# Patient Record
Sex: Male | Born: 1945 | Race: White | Hispanic: No | Marital: Married | Smoking: Current every day smoker
Health system: Southern US, Community
[De-identification: ages and names within clinical notes are randomized; demographics above are authoritative.]

## PROBLEM LIST (undated history)

## (undated) DIAGNOSIS — F1920 Other psychoactive substance dependence, uncomplicated: Secondary | ICD-10-CM

## (undated) DIAGNOSIS — K5790 Diverticulosis of intestine, part unspecified, without perforation or abscess without bleeding: Secondary | ICD-10-CM

## (undated) DIAGNOSIS — I10 Essential (primary) hypertension: Secondary | ICD-10-CM

## (undated) DIAGNOSIS — F0390 Unspecified dementia without behavioral disturbance: Secondary | ICD-10-CM

## (undated) HISTORY — PX: ANKLE SURGERY: SHX546

---

## 2002-01-19 ENCOUNTER — Encounter: Payer: Self-pay | Admitting: Emergency Medicine

## 2002-01-20 ENCOUNTER — Inpatient Hospital Stay (HOSPITAL_COMMUNITY): Admission: EM | Admit: 2002-01-20 | Discharge: 2002-01-24 | Payer: Self-pay | Admitting: Emergency Medicine

## 2005-02-07 ENCOUNTER — Inpatient Hospital Stay (HOSPITAL_COMMUNITY): Admission: EM | Admit: 2005-02-07 | Discharge: 2005-02-08 | Payer: Self-pay | Admitting: Emergency Medicine

## 2005-02-08 ENCOUNTER — Emergency Department (HOSPITAL_COMMUNITY): Admission: EM | Admit: 2005-02-08 | Discharge: 2005-02-08 | Payer: Self-pay | Admitting: Emergency Medicine

## 2005-04-19 ENCOUNTER — Inpatient Hospital Stay (HOSPITAL_COMMUNITY): Admission: EM | Admit: 2005-04-19 | Discharge: 2005-04-23 | Payer: Self-pay | Admitting: Emergency Medicine

## 2006-06-18 ENCOUNTER — Emergency Department (HOSPITAL_COMMUNITY): Admission: EM | Admit: 2006-06-18 | Discharge: 2006-06-19 | Payer: Self-pay | Admitting: Emergency Medicine

## 2007-04-06 ENCOUNTER — Ambulatory Visit: Payer: Self-pay | Admitting: Internal Medicine

## 2007-04-06 ENCOUNTER — Ambulatory Visit: Payer: Self-pay | Admitting: Cardiology

## 2007-04-06 ENCOUNTER — Inpatient Hospital Stay (HOSPITAL_COMMUNITY): Admission: EM | Admit: 2007-04-06 | Discharge: 2007-04-18 | Payer: Self-pay | Admitting: Emergency Medicine

## 2007-04-09 ENCOUNTER — Ambulatory Visit: Payer: Self-pay | Admitting: Vascular Surgery

## 2007-04-09 ENCOUNTER — Encounter: Payer: Self-pay | Admitting: Internal Medicine

## 2007-05-30 ENCOUNTER — Ambulatory Visit: Payer: Self-pay | Admitting: Nurse Practitioner

## 2007-05-30 DIAGNOSIS — F17200 Nicotine dependence, unspecified, uncomplicated: Secondary | ICD-10-CM

## 2007-05-30 DIAGNOSIS — E871 Hypo-osmolality and hyponatremia: Secondary | ICD-10-CM | POA: Insufficient documentation

## 2007-05-30 DIAGNOSIS — I426 Alcoholic cardiomyopathy: Secondary | ICD-10-CM

## 2007-05-30 DIAGNOSIS — D759 Disease of blood and blood-forming organs, unspecified: Secondary | ICD-10-CM | POA: Insufficient documentation

## 2007-05-30 DIAGNOSIS — F1010 Alcohol abuse, uncomplicated: Secondary | ICD-10-CM | POA: Insufficient documentation

## 2007-05-30 DIAGNOSIS — E46 Unspecified protein-calorie malnutrition: Secondary | ICD-10-CM

## 2007-05-30 DIAGNOSIS — IMO0002 Reserved for concepts with insufficient information to code with codable children: Secondary | ICD-10-CM | POA: Insufficient documentation

## 2007-05-30 DIAGNOSIS — E876 Hypokalemia: Secondary | ICD-10-CM

## 2007-06-19 ENCOUNTER — Ambulatory Visit: Payer: Self-pay | Admitting: *Deleted

## 2007-11-19 ENCOUNTER — Ambulatory Visit: Payer: Self-pay | Admitting: Nurse Practitioner

## 2007-11-19 DIAGNOSIS — I10 Essential (primary) hypertension: Secondary | ICD-10-CM

## 2007-11-19 DIAGNOSIS — R5381 Other malaise: Secondary | ICD-10-CM | POA: Insufficient documentation

## 2007-11-19 DIAGNOSIS — R5383 Other fatigue: Secondary | ICD-10-CM

## 2007-11-19 LAB — CONVERTED CEMR LAB
ALT: 9 units/L (ref 0–53)
AST: 13 units/L (ref 0–37)
Basophils Absolute: 0.1 10*3/uL (ref 0.0–0.1)
Basophils Relative: 1 % (ref 0–1)
Calcium: 9.8 mg/dL (ref 8.4–10.5)
Chloride: 96 meq/L (ref 96–112)
Creatinine, Ser: 0.9 mg/dL (ref 0.40–1.50)
MCHC: 31.9 g/dL (ref 30.0–36.0)
Monocytes Absolute: 0.8 10*3/uL (ref 0.1–1.0)
Neutro Abs: 5.2 10*3/uL (ref 1.7–7.7)
Neutrophils Relative %: 62 % (ref 43–77)
Platelets: 292 10*3/uL (ref 150–400)
Potassium: 4.4 meq/L (ref 3.5–5.3)
RDW: 13.4 % (ref 11.5–15.5)
Sodium: 141 meq/L (ref 135–145)

## 2007-11-25 ENCOUNTER — Telehealth (INDEPENDENT_AMBULATORY_CARE_PROVIDER_SITE_OTHER): Payer: Self-pay | Admitting: Nurse Practitioner

## 2008-07-30 ENCOUNTER — Telehealth (INDEPENDENT_AMBULATORY_CARE_PROVIDER_SITE_OTHER): Payer: Self-pay | Admitting: Nurse Practitioner

## 2008-08-28 ENCOUNTER — Telehealth (INDEPENDENT_AMBULATORY_CARE_PROVIDER_SITE_OTHER): Payer: Self-pay | Admitting: Nurse Practitioner

## 2008-09-02 ENCOUNTER — Encounter (INDEPENDENT_AMBULATORY_CARE_PROVIDER_SITE_OTHER): Payer: Self-pay | Admitting: *Deleted

## 2010-12-06 NOTE — H&P (Signed)
NAME:  Edgar Vazquez, Edgar Vazquez                 ACCOUNT NO.:  [id number]   MEDICAL RECORD NO.:  [id number]          PATIENT TYPE:  EMS   LOCATION:  ED                           FACILITY:  [facility]   PHYSICIAN:  Mobolaji B. Bakare, M.D.DATE OF BIRTH:  09/09/1945   DATE OF ADMISSION:  04/06/2007  DATE OF DISCHARGE:                              HISTORY & PHYSICAL   PRIMARY CARE PHYSICIAN:  Unassigned.   CHIEF COMPLAINT:  Lower-extremity weakness and numbness.   HISTORY OF PRESENTING COMPLAINT:  Mr. Peppard is a 65 year old Caucasian  male who resides with his girlfriend.  About 2 weeks ago, he started  experiencing lower-extremity weakness and numbness.  This progressed to  the point where he became bedridden, and he could not walk without  assistance, hence, his girlfriend brought him to the hospital with a  commitment paper.   Patient is able to give history.  He is alert, oriented in time, place  and person.  He admits to having drinking beer one-six pack per day for  several years.  He denies any back pain or trauma.  He has developed  urinary incontinence because he could not get himself to the bathroom  quickly.  Patient has the sensation of bladder fullness.  Likewise, he  knows when he defecates, and he admits to having loose stools about 1 to  2 times a day, which has been ongoing for about a week.  There is no  associated abdominal pain or vomiting but he does have nausea  occasionally.  There is no fever.  He has not had any significant weight  loss.  His usual weight is 150 to 155 pounds.  He admits to having good  appetite.   Patient's last alcohol intake was about 2 weeks ago when he started  getting sick.   REVIEW OF SYSTEMS:  He denies shortness of breath, chest pain, no  vomiting or headaches.  No change in his vision.  He has no weight loss.   PAST MEDICAL HISTORY:  1. Alcohol abuse.  2. History of alcohol withdrawal.  3. History of altered mental status secondary to alcohol  withdrawal.  4. History of hyponatremia.  5. History of peptic ulcer disease.   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  He had surgical repair.   MEDICATIONS:  None.   ALLERGIES:  No known drug allergies.   FAMILY HISTORY:  Significant for emphysema in his mom who is still  alive. Father passed away from cerebral hemorrhage.  Patient has 4 other  siblings.   SOCIAL HISTORY:  He is unemployed.  He lives with his girlfriend and his  girlfriend's son.  Patient smokes cigarettes.  He has been trying to cut  back on his cigarette use from 1 to 2 packs per day to 4 to 5 cigarettes  per day.  He drinks one 6-pack a day.  He quit about 2 weeks ago when he  became sick.   INITIAL VITALS:  Temperature 98.0, blood pressure 115/100, pulse 100,  respiratory rate  20, O2 sats of 97% on room air.  On examination, the  patient is awake, alert, oriented in time, place and person.  He is not  in respiratory distress.  He appears unkempt.  Not pale.  Anicteric.  No  elevated JVD.  No carotid bruit.  Mucous membranes dry.  No oral thrush.  LUNGS:  Clear clinically to auscultation.  CVS:  S1, S2 with no murmurs, gallops or rubs.  ABDOMEN:  Not distended.  Soft, nontender.  Bowel sounds presents.  EXTREMITIES:  No pedal edema or calf tenderness.  Bilateral feet  contraction with dorsiflexion.  SKIN:  Multiple wound scars on the lower extremity resembling healing  impetigo.  CNS:  Cranial nerves intact.  Muscle power 5/5 in the upper limbs.  Patient can barely lift the right lower extremity against gravity.  He  could not lift left lower extremity against gravity.  Dorsiflexion is 3+  bilaterally. Plantar flexion 2/5 bilaterally.  Tendon reflexes, trace  good ankle and knee deep tendon reflexes, 1+ good triceps and biceps  muscle mass.  Lower extremity calf muscles appear atrophic with  diminished muscle tone.  COORDINATION:  Patient has past-pointing  bilaterally.   INITIAL LABORATORY DATA:  Urine drug screen:  Negative.  Urine  microscopy:  White cells __________  bacteria.  Urinalysis shows  specific gravity of 1.018, positive for nitrites, negative for leukocyte  esterase, lipase 22, alcohol level less than 5, sodium 151, potassium  4.4, chloride 95, CO2 26, glucose 155, BUN 5, creatinine 0.8.  Segs:  Alkaline phosphatase 56, AST 55, ALT 52, total protein 6.9, albumin 2.6,  calcium 8.9.  White cell count 7.5, hemoglobin 17.7, hematocrit 56.0,  platelets 281, lymphocytes 11%, neutrophils 75%.  Lumbosacral x-ray  shows that he has degenerative changes not acute to abnormality.   ASSESSMENT AND PLAN:  Mr. Arca is a 65 year old Caucasian male  presenting with progressive lower-extremity weakness associated with  numbness and difficulty with walking.  He has become bedridden.  He will  be admitted for further evaluation.   ADMISSION DIAGNOSIS:  1. Bilateral lower-extremity weakness.  This is a progressive      bilaterally lower-extremity weakness.  We need to rule out cord      compression and spinal stenosis.  Will obtain MRI of the      lumbosacral region and also head CT scan to rule out      hydronephrosis.  Will ask PT and OT to evaluate patient.  Neurology      may be consulted if no acute findings on MRI to warrant      neurosurgical involvement.  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  We      checked vitamin B12 and folate.  Start thiamine 100 milligrams      orally daily, multivitamin 1 daily and folic acid 1 milligrams      daily.  3. Elevated hemoglobin and hematocrit and doubt this is primary      polycythemia.  Will check ferritin level, hydrate and recheck CBC      in a.m.  4. Alcohol abuse.  Patient's last alcohol intake was about 2 weeks ago      according to his report.  He is currently not having withdrawal      symptoms.  Will nevertheless add Ativan 1 to 2 milligrams orally/IV      every  2 hours as needed for withdrawal symptoms.  5. Elevated AST.  Likely secondary to alcohol  abuse.  Will not pursue      further.  He denies IV drug abuse.  6. Hyponatremia.  Likely secondary to poor oral intake and loose      stools.  Will give IV fluid and will check BMET in the morning.  7. Hyperglycemia.  Patient is not known to be diabetic.  This is      probably reflecting random blood glucose.  Will check hemoglobin      A1c and monitor CBG during the course of  hospitalization.  8. Diarrhea/loose stools.  Probably alcohol related.  Will check stool      for CD toxins over parasite culture and fecal      leukocytes.  9. Hypertension.  Two readings of blood pressure in the emergency room      were elevated.  Will start low-dose atenolol 25 milligrams daily.      Mobolaji B. Corky Downs, M.D.  Electronically Signed     MBB/MEDQ  D:  04/06/2007  T:  04/06/2007  Job:  [id number]

## 2010-12-06 NOTE — Discharge Summary (Signed)
NAME:  Edgar Vazquez, LLANAS NO.:  [id number]   MEDICAL RECORD NO.:  [id number]          PATIENT TYPE:  INP   LOCATION:  1422                         FACILITY:  [facility]   PHYSICIAN:  Hillery Aldo, M.D.   DATE OF BIRTH:  30-Dec-1945   DATE OF ADMISSION:  04/06/2007  DATE OF DISCHARGE:                               DISCHARGE SUMMARY   PRIMARY CARE PHYSICIAN:  The patient attends the [facility].  He has no  local primary care physician.   DISCHARGE DIAGNOSES:  1. 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  2. Hypoxic respiratory failure.  3. Sepsis.  4. Pneumonia, community acquired versus aspiration.  5. Alcohol dependency.  6. Alcohol withdrawal syndrome.  7. Hypertension.  8. Hyponatremia.  9. Hypokalemia.  10.Diarrhea.  11.Moderate protein calorie malnutrition.  12.Degenerative disk disease.  13.Tobacco abuse.  14.Thrombocytosis.  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   DISCHARGE MEDICATIONS:  (May be amended by discharging physician.)  1. Clonidine 0.2 mg transdermal patch weekly.  2. Ensure t.i.d.  3. Folic acid 1 mg p.o. daily.  4. Avelox 400 mg p.o. through April 19, 2007.  5. Nicotine patch 14 mg transdermally daily.  6. Protonix 40 mg p.o. daily.  7. Thiamine 100 mg p.o. daily.  8. Albuterol two puffs q.4 h. p.r.n.  9. Lomotil 1-2 tablets q.i.d. p.r.n.  10.Ativan 1 mg to 2 mg p.o. q.3 h. p.r.n.   CONSULTATIONS:  1. Dr. Jeanie Sewer of Psychiatry.  2. Dr. Marchelle Gearing, of Pulmonary Critical Care Medicine.   BRIEF ADMISSION HISTORY OF PRESENT ILLNESS:  The patient is a 65-year-  old male who presented with progressive lower extremity weakness and  numbness to the point where he had become bedridden and could not walk  without assistance.  He has a known history of alcohol dependency.  He  was admitted for further evaluation and workup.   PROCEDURES AND DIAGNOSTIC STUDIES:  1. Lumbar spine films on April 06, 2007  showed diffuse      degenerative changes with no acute abnormalities.  2. CT scan of the head on April 06, 2007 showed chronic      microvascular changes with no acute abnormalities.  3. MRI of the lumbar spine on April 07, 2007 showed degenerative      disk disease.  There was no fracture, mass or compression of the      conus medullaris.  4. Chest x-ray on April 09, 2007 showed new bilateral airspace      disease.  5. Chest x-ray on April 10, 2007 showed improving pulmonary edema      with worsening left pleural effusion and bibasilar atelectasis.  6. Chest x-ray on April 11, 2007 showed slight decrease in      pulmonary vascular congestion.  7. Chest x-ray on April 12, 2007 showed stable left pleural      effusion and left lower lobe atelectasis versus infiltrate.  8. Two-dimensional echocardiogram on April 09, 2007 showed mild to      moderate decrease in left ventricular systolic function with an      ejection fraction of 45%.  Study was inadequate for the evaluation      of left ventricular regional wall motion.  The right ventricle was      mild to moderately dilated.  Right ventricular systolic function      was mild to moderately reduced.  The right atrium was mildly      dilated.  9. Lower extremity venous Dopplers on April 09, 2007 showed no      evidence of deep venous thrombosis, superficial thrombosis or      Baker's cyst.  There was rouleaux flow noted in the popliteal      veins.   DISCHARGE LABORATORY VALUES:  Sodium was 138, potassium 4, chloride 102,  bicarb 29, glucose 107, BUN 1, creatinine 0.72.  CBC showed a white  blood cell count of 6.1, hemoglobin 15.8, hematocrit 47 and platelet  count 421,000.   HOSPITAL COURSE:  PROBLEM #1 - LOWER EXTREMITY WEAKNESS:  The patient  was admitted and a diagnostic workup was initiated with the imaging  studies as noted above.  It was felt that his lower extremity weakness  was likely due to  alcohol-related neuropathy.  The imaging modalities  discussed above did not reveal any compression of the cord or other  problems.  The patient was seen in consultation with the physical and  occupational therapists with recommendations for ongoing rehabilitation.  He was supplemented with vitamin B12 and folic acid during his hospital  stay as well, given his history of alcoholism.   PROBLEM #2 - HYPOXIC RESPIRATORY FAILURE SECONDARY TO PNEUMONIA AND  SEPSIS:  The patient was seen in consultation with the Pulmonary  Critical Care specialist.  He was transiently put on BiPAP, but often  refused this due to agitation and alcohol withdrawal.  With treatment of  his underlying sepsis and pneumonia, his respiratory status improved  dramatically.  Because it was felt that his respiratory failure was  related to pneumonia, he was put on broad-spectrum antibiotics.  At this  point, his respiratory status remained stable.  He has completed 5 days  of therapy with vancomycin and Zosyn and has been transitioned to p.o.  Avelox.  Plans are to have him complete an 10-day course of antibiotic  therapy.   PROBLEM #3 - ALCOHOLISM:  The patient did have a history of heavy  alcohol use and did have withdrawal symptoms while in the hospital.  He  was put on Ativan protocol and at this point he has been detoxified.   PROBLEM #4 - HYPERTENSION:  The patient did develop some hypertension  which was treated with a clonidine patch.  This has controlled his blood  pressure well.   PROBLEM #5 - HYPONATREMIA:  This is likely related to his history of  alcoholism.  It resolved with IV fluids.   PROBLEM #6 - HYPOKALEMIA:  The patient was appropriately repleted.   PROBLEM #7 - DIARRHEA:  The patient did develop some transient diarrhea.  C. difficile toxin studies and stool cultures were negative.  He was  treated with Lomotil p.r.n. and at this point, he is not complaining of  any further diarrhea.    PROBLEM #8 - MODERATE PROTEIN CALORIE MALNUTRITION:  The patient is  malnourished, likely due to his alcoholism.  He was put on Ensure  supplements and at this point, has been eating well.   PROBLEM #9 - TOBACCO ABUSE:  The patient was counseled on cessation and  provided with a nicotine patch during the course of his hospitalization.  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  He has a moderate decrease in systolic  function as noted on 2-D echo echocardiography.  At this point, he has  no evidence of congestive heart failure and should be followed closely  as an outpatient.   DISPOSITION:  The patient's disposition is likely to be through an  inpatient rehabilitation program or skilled nursing facility so he can  have ongoing physical and occupational therapy.  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  He is medically  stable for discharge and will be discharged to such a facility when one  is identified and accepts him on transfer.      Hillery Aldo, M.D.  Electronically Signed     CR/MEDQ  D:  04/14/2007  T:  04/15/2007  Job:  [id number]

## 2010-12-06 NOTE — Discharge Summary (Signed)
NAME:  Edgar Vazquez, Edgar Vazquez NO.:  [id number]   MEDICAL RECORD NO.:  [id number]          PATIENT TYPE:  INP   LOCATION:  1422                         FACILITY:  [facility]   PHYSICIAN:  Isidor Holts, M.D.  DATE OF BIRTH:  10-14-1945   DATE OF ADMISSION:  04/06/2007  DATE OF DISCHARGE:  04/18/2007                               DISCHARGE SUMMARY   ADDENDUM:   [facility]   Patient is unassigned to [place].   DISCHARGE DIAGNOSES:  Refer to interim discharge summary dated April 14, 2007, by Dr. Hillery Aldo.   For discharge medications, consultations, procedures, admission history,  and clinical course, refer to above-mentioned interim summary.  For the  period from April 15, 2007 until April 18, 2007, the following  are pertinent:  The patient remained clinically stable and asymptomatic.  His nutritional needs were addressed per the nutrition's  recommendations.  He continued to undergo physical therapy/occupational  therapy and has experienced considerable improvement in bilateral lower  extremity strength, although he is still somewhat weak and  deconditioned. Clinically, the pneumonia has resolved and patient  completed antibiotic course on April 18, 2007.  He has had no  further electrolyte abnormalities and had no clinical evidence of heart  failure.   DISPOSITION:  Patient was considered clinically stable to be discharged  on April 18, 2007.   DIET:  Heart-healthy.   ACTIVITY:  Recommended to increase activity slowly, otherwise per  PT/OT/rehab.   FOLLOW-UP INSTRUCTIONS:  Patient is recommended to follow up routinely  with his primary MD at the [place].      Isidor Holts, M.D.  Electronically Signed     CO/MEDQ  D:  04/18/2007  T:  04/18/2007  Job:  [id number]

## 2010-12-06 NOTE — Consult Note (Signed)
NAME:  Edgar Vazquez, Edgar Vazquez NO.:  [id number]   MEDICAL RECORD NO.:  [id number]          PATIENT TYPE:  INP   LOCATION:  1222                         FACILITY:  [facility]   PHYSICIAN:  Antonietta Breach, M.D.  DATE OF BIRTH:  06/04/1946   DATE OF CONSULTATION:  04/08/2007  DATE OF DISCHARGE:                                 CONSULTATION   REASON FOR CONSULTATION:  Alcohol dependence.   REFERRING PHYSICIAN:  [facility].   HISTORY OF PRESENT ILLNESS:  Edgar Vazquez is a 65 year old male  admitted to the [facility] on April 06, 2007.   Edgar Vazquez has been drinking excessive alcohol and has been experiencing  lower extremity weakness and numbness.  He has become bedridden and has  had trouble walking without assistance.  He is estimated to have been  drinking at least a six-pack of beer per day for at least a year.  He is  having decreased energy but completely denies depressed mood. He  emphatically denies any suicidal thoughts. He has constructive interests  and goals but he is too weak to achieve them at this time. He has no  hallucinations or delusions.   He can recall the events of the day.  He is cooperative with bedside  care.   PAST PSYCHIATRIC HISTORY:  The patient completely denies any history of  suicidal thoughts or suicide attempts.  He does have history of alcohol  withdrawal symptoms and has had a history of altered mental status  associated with alcohol withdrawal.   FAMILY PSYCHIATRIC HISTORY:  None known.   SOCIAL HISTORY:  Edgar Vazquez has been living with his girlfriend and his  girlfriend's son.  He is unemployed.  He does not use any illegal drugs  although he does smoke a pack of cigarettes.   PAST MEDICAL HISTORY:  1. The patient has a history of peptic ulcer disease.  2. He has had a crush injury to his left foot in 1968 and has had      surgical repair for that.  3. He also has a history of hyponatremia  4. The patient has lower  extremity weakness, peripheral neuropathy.  5. He has had elevated transaminases.  AST 55, ALT 52.  His albumin on      admission was 2.6.   MEDICATIONS:  The MAR is reviewed.   REVIEW OF SYSTEMS:  Noncontributory.   PHYSICAL EXAM:  Edgar Vazquez is a middle-aged male, lying in the supine  position in his hospital bed.  Vital signs are stable.  He had no  abnormal involuntary movements.   MENTAL STATUS EXAM:  Edgar Vazquez is slightly stuporous at the beginning of  the exam and then progresses to being alert as the exam proceeds.  He  has a slightly decreased attention span. His concentration is mildly  decreased. His affect is bright and appropriate.  Mood is within normal  limits.  He is oriented consistently in all spheres.  His memory is  intact to immediate, recent and remote.  He recalls the sitter in the  room earlier in the  day.  His speech is within normal limits.  Fund of  knowledge and intelligence are within normal limits.  Thought process  logical in scope, goal-directed. No loose association.  Thought content:  No thoughts of harming himself.  No thoughts of harming others.  No  delusions.  No hallucinations.  Insight is partial.  Judgment is intact.  The patient does appear to have some insight into his alcohol problem.  He is most concerned about his physical symptoms but he is expressing  some partial interest in alcohol rehabilitation.   ASSESSMENT:  Axis I:  Alcohol dependence.  Axis II:  Deferred.  Axis III:  See general medical problems above.  Axis IV:  General medical, primary support group.  Axis V:  55.   Edgar Vazquez is not at risk to harm himself or others.  He agrees to call  emergency services immediately for any thoughts of harming himself,  thoughts of harming others or other psychiatric emergency symptoms.   RECOMMENDATIONS:  1. Would continue a multivitamin daily, folic acid 1 mg daily and      thiamine 100 mg daily indefinitely.  2. Once the patient is  medically cleared, would involve him in      alcohol/drug services for alcohol relapse prevention.  Would also      recommend 12-step groups.      Antonietta Breach, M.D.  Electronically Signed     JW/MEDQ  D:  04/09/2007  T:  04/09/2007  Job:  [id number]

## 2010-12-09 NOTE — Consult Note (Signed)
NAME:  Edgar Vazquez, Edgar Vazquez                 ACCOUNT NO.:  [id number]   MEDICAL RECORD NO.:  [id number]          PATIENT TYPE:  INP   LOCATION:  5737                         FACILITY:  [facility]   PHYSICIAN:  Pramod P. Pearlean Brownie, MD    DATE OF BIRTH:  1946-06-08   DATE OF CONSULTATION:  DATE OF DISCHARGE:  04/23/2005                                   CONSULTATION   REFERRING PHYSICIAN:  Katha Cabal, MD.   REASON FOR REFERRAL:  Confusion.   HISTORY OF PRESENT ILLNESS:  Edgar Vazquez is a 65 year old Caucasian male, who  was admitted three days ago with agitation and confusion.  On admission, he  was thought to be in alcohol withdrawal and has been treated accordingly,  but he has had some fluctuating confusion since then.  He has a long history  of alcohol abuse and has had multiple admissions for alcohol intoxication as  well as withdrawal.  He has refused or failed detoxification in the past.  There is no history of seizure, focal extremity weakness, stroke, TIA, or  significant neurological problems.   PAST MEDICAL HISTORY:  1.  Alcohol abuse.  2.  Peptic ulcer disease.   HOME MEDICATIONS:  None.   MEDICATION ALLERGIES:  None.   FAMILY HISTORY:  Noncontributory.   SOCIAL HISTORY:  He is retired.  He used to be a Curator.  He lives with  his girlfriend in [place].  He smokes and drinks beer.   REVIEW OF SYSTEMS:  Not obtainable.   PHYSICAL EXAMINATION:  GENERAL:  A middle-aged Caucasian male, who is not in  distress.  VITAL SIGNS:  He is afebrile.  Today's temperature 98.9, pulse rate 74 per  minute and regular, respiratory rate 20 per minute, blood pressure 114/64,  SATs 95% on room air.  HEAD:  Nontraumatic.  NECK:  Supple without bruit.  ENT:  Unremarkable.  CARDIAC:  No murmur or gallop.  LUNGS:  Clear to auscultation.  ABDOMEN:  Soft and nontender.  NEUROLOGICAL:  Patient at present is awake, alert, calm, and cooperative.  There is no aphasia, apraxia, or dysarthria.   He has intact attention and  registration.  His recall is diminished x0 out of 3.  There is no  confabulation or hallucinations.  Eye movements are full range without any  restriction.  Face is symmetric bilaterally.  Movements are normal.  Tongue  is midline.  Motor system exam reveals symmetric upper and lower  extremities, strength, tone, reflexes, coordination and sensation, and he  walks with a steady gait.   DATA REVIEWED:  A CT scan of the head done on April 19, 2005, reveals no  acute abnormalities.  Small vessel chronic microangiopathic changes are  noted.  The rest of the labs are fairly unremarkable except for elevated  ammonia of 36 and AST of 50.  Sodium on admission was low at 126, but  yesterday it was 134.   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  PLAN:  He is now admitted for further brain diagnostic testing as indicated.  Recommend a tapering course of benzodiazepines for his alcohol withdrawal as  well as a trial of antidepressant medications.  I will consult the patient  to quit drinking alcohol.  I also had a long telephone discussion with the  patient's significant other, who seems fairly supportive of the patient  quitting alcohol.  The patient has no family doctor and would benefit by  being referred to [facility] for subsequent medical followup and  medications.   Thank you for the referral.  Kindly call for questions.           ______________________________  Sunny Schlein. Pearlean Brownie, MD     PPS/MEDQ  D:  04/23/2005  T:  04/23/2005  Job:  [id number]

## 2010-12-09 NOTE — Discharge Summary (Signed)
. [facility]  Patient:    Edgar Vazquez, Edgar Vazquez Visit Number: [id number] MRN: [id number]          Service Type: MED Location: 3000 3022 01 Attending Physician:  Lonell Face Dictated by:   Theressa Millard, M.D. Admit Date:  01/19/2002 Disc. Date: 01/23/02                             Discharge Summary  ADMISSION DIAGNOSES: 1. Anxiety. 2. Hallucinations, possible alcohol withdrawal.  DISCHARGE DIAGNOSES: 1. Alcohol dependence. 2. Alcohol withdrawal symptoms consistent with delirium tremens with    hallucinations, diaphoresis, fever, hypertension, tachycardia, all    resolved. 3. Hypokalemia, resolved. 4. Hypomagnesemia, resolved.  HISTORY OF PRESENT ILLNESS:  The patient is a 65 year old white male.  Please see admission History of Present Illness examination for details, but basically the patient has been without significant alcohol ingestion of the 48 hours prior to admission, and he was having early withdrawal symptoms in the emergency room.  HOSPITAL COURSE:  The patient was admitted and initially treated with Ativan. Despite this, over the subsequently 24 hours he became quite agitated, was hallucinating, diaphoretic, febrile, and hypertensive.  He was started on Ativan protocol including 2 mg of Ativan every 6 hours with additional doses q.1-2h. p.r.n.  After approximately 24 hours, during which time he required heavy restraints, he finally settled down, and the subsequent hospitalization was characterized by cooperation, calmness, but still some confusion.  He initially had some mid epigastric discomfort on admission.  He was initially treated with Pepcid, but that was discontinued, and all the discomfort was gone once the withdrawal symptoms had abated.  In regard to fever, chest x-ray, urinalysis, and old laboratory were all within normal limits, and there was no evidence of systemic infection.  Over the last 48 hours of the  hospitalization, he was much calmer, although a sitter was required for patients safety.  He did not have to be restrained at all, and he was pleasantly confused and cooperative.  Ativan continued to be tapered downward during the hospital.  He is transferred to the [facility] in [place] for further treatment of his alcohol dependence.  DISCHARGE MEDICATIONS:  Ativan 1 to 2 mg q.6h. and 1 to 2 mg q.3h. p.r.n. He has already been treated with thiamine, and his potassium and magnesium have been repleted.  CONDITION UPON DISCHARGE:  Improved. Dictated by:   Theressa Millard, M.D. Attending Physician:  Lonell Face DD:  01/23/02 TD:  01/23/02 Job: [id number]

## 2010-12-09 NOTE — Discharge Summary (Signed)
[place]  Patient:    KEMAR, PANDIT Visit Number: [id number] MRN: [id number]          Service Type: MED Location: 3000 3022 01 Attending Physician:  Darnelle Bos Dictated by:   Lilla Shook, M.D. Admit Date:  01/19/2002 Discharge Date: 01/24/2002                             Discharge Summary  ADDENDUM  The patient is being discharged today to his home.  He was initially to be discharged to the [facility] who did not have room in their facility for him at this time.  He was much more stable and was evaluated by the ACT team in consultation with Dr. Jeanie Sewer.  It was determined on July 4 with his current mental status and currently not in alcohol withdrawal that he does not qualify for inpatient commitment for substance abuse.  He declined to be admitted to the [facility] who, again, still did not have any bed availability.  He was given information about outpatient substance abuse services and indicated that he would stay with his brother.  He denied any suicidal intentions, homicidal intentions, and was alert and oriented.  On my exam, I found the same things to be true.  He is a little less tremulous, feels okay, is alert and aware of what the discharge plan currently is.  His brother is on his way to pick him up.  He will go without discharge medications since he was on none as an outpatient.  He has the phone numbers in his pocket for outpatient substance abuse follow-up and is to return to this facility if he becomes more tremulous with more abdominal pain or other symptoms of withdrawal. Dictated by:   Lilla Shook, M.D. Attending Physician:  Darnelle Bos DD:  01/24/02 TD:  01/28/02 Job: [id number]

## 2010-12-09 NOTE — H&P (Signed)
NAME:  Edgar Vazquez, GOMILLION NO.:  [id number]   MEDICAL RECORD NO.:  [id number]          PATIENT TYPE:  EMS   LOCATION:  MAJO                         FACILITY:  [facility]   PHYSICIAN:  Lonia Blood, M.D.      DATE OF BIRTH:  1945/09/18   DATE OF ADMISSION:  02/06/2005  DATE OF DISCHARGE:                                HISTORY & PHYSICAL   PRIMARY CARE PHYSICIAN:  The patient is unassigned.   PRESENTING COMPLAINT:  Altered mental status change.   HISTORY OF PRESENT ILLNESS:  The patient is a 65 year old white male with  known history of alcoholism, previous admission here in June 2003,  subsequently treated at [facility] over at [facility].  The patient has  had multiple inpatient and outpatient treatment for alcoholism.  His  brother, who lives in [place], apparently called and gave history of  patient's most recent treatment.  He is here again today with altered mental  status change and serum alcohol level of more than 400.  The patient is  currently better mentally but still in bad shape.  The patient is unable to  give any meaningful history.  History is gleaned from his brother as well as  old records.   PAST MEDICAL HISTORY:  Mainly alcoholism.   MEDICATIONS:  None on record.   ALLERGIES:  No known drug allergies.   SOCIAL HISTORY:  Per brother and old records, patient is currently  alcoholic, apparently just moved in with a new girlfriend.  After his  treatment with Dr. Pamala Hurry in [place], he lived with his brother and his  mother for two years.  The patient moved out one year ago.  A history of  tobacco smoking of a half pack to one pack per day.   FAMILY HISTORY:  Not obtainable at this time.   REVIEW OF SYSTEMS:  Also not obtainable as the patient is not reliable  historically at this point.   PHYSICAL EXAMINATION:  VITAL SIGNS:  Temperature is 97.7, blood pressure  102/69, pulse 83, respiratory rate 18.  His saturation is 95% on room air.  GENERAL:   The patient is aggressive and combative, restrained at both of the  wrists and the ankles.  He smells of alcohol and very much unkempt.  HEENT:  Pupils equal, round and reactive.  Poor dentition.  NECK:  Supple, no JVD, no lymphadenopathy.  RESPIRATORY:  He has good air entry bilaterally, no wheezes or rales, no  crackles.  CARDIOVASCULAR:  The patient is currently tachycardic.  ABDOMEN:  Full, nontender, positive bowel sounds.  EXTREMITIES:  The patient has no edema, cyanosis or clubbing.  He has  visible athlete's foot at the sole of the foot.   His labs showed a white count of 4.8, hemoglobin 14.8, platelets 227, with  normal differential.  Acetaminophen level less than 10.  Alcohol level  initially 407, over time dropped to 323 and then 231 and currently 127.  Lipase 28.  Salicylate less than 4.  Sodium 136, potassium 3.9, chloride  100, CO2 22, glucose 103, BUN 3, creatinine 0.8,  calcium 8.4.  Total protein  7.2, albumin 3.7, AST 94, ALT 45, alkaline phosphatase 55, total bilirubin  0.7.  His UA is essentially clean with urine rbc's 3-6.  Urine drug screen  also negative.  Tricyclics were negative.   ASSESSMENT:  This 65 year old known alcoholic presented with alcohol  intoxication and associated agitation.  The patient's alcohol level is  dropping.  The main fear will be more for delirium tremens in the next one  to two days.  At this time the patient is restrained.   PLAN:  1.  Alcohol intoxication:  We will keep the patient in a telemetry bed.  We      will maintain restraints as needed.  Continue with p.r.n. Ativan as      needed to control his agitation.  Primarily will focus on conservative      measures at this point.  Once the patient is fully awake and doing      better, will get psychiatry consult and possibly inpatient treatment if      patient qualifies.  For his alcoholism, will proceed as above.  I will      add thiamine and folate while the patient is in the  hospital.  Will      observe him for DT.  If he goes into DT in the next probably two to      three days, we will initiate a DT protocol.  2.  Tobacco abuse:  We will offer nicotine patch for patient to use if he      gets agitated.  In the meantime, once the patient is fully awake and      stable, would initiate tobacco cessation counseling.  3.  Athlete's foot:  The patient seems to have some tinea pedis.  I will      proceed to give him some topical treatment.  4.  Mild liver function tests increase:  This is most likely secondary to      his alcoholism.  Will continue with a multivitamin and thiamine and      folate as indicated.       LG/MEDQ  D:  02/07/2005  T:  02/07/2005  Job:  [id number]

## 2010-12-09 NOTE — Discharge Summary (Signed)
NAMEANAND, Edgar NO.:  [id number]   MEDICAL RECORD NO.:  [id number]          PATIENT TYPE:  INP   LOCATION:  5737                         FACILITY:  [facility]   PHYSICIAN:  Edgar Vazquez, M.D.DATE OF BIRTH:  [birth year]   DATE OF ADMISSION:  04/19/2005  DATE OF DISCHARGE:  04/23/2005                                 DISCHARGE SUMMARY   ADMISSION DIAGNOSES:  1.  Altered mental status with a working diagnosis of alcohol      withdrawal/alcohol related seizure.  2.  Dehydration.  3.  Hypernatremia.  4.  Alcohol dependence.   DISCHARGE DIAGNOSES:  1.  Alcohol dependence.  2.  Resolved confusional state, improved mental status changes, mental      status change secondary to alcohol withdrawal.  3.  Depression.  4.  Tobacco abuse.   DISPOSITION:  Patient discharged to home, to follow up with the primary care  physician within one week.   CONSULTATIONS:  Consultation is requested of neurology, Dr. Pearlean Brownie.   HISTORY AND PHYSICAL:  For complete history and physical please refer to the  detailed note by Dr. Karie Mainland on April 19, 2005.  Briefly this is a 65-year-  old Caucasian male who was brought in by emergency medical services  secondary to confusion and agitation. The patient was known to have alcohol  abuse problems. The patient was admitted with a working diagnosis of  confusional state secondary to alcohol withdrawal/alcohol related seizures.  The patient known to have a past medical history of peptic ulcer disease and  gastritis, known to have previous admission secondary to similar changes in  mental status.   ADMISSION LABS:  Sodium 126, potassium 3.6, chloride 94, glucose 102, BUN  less than 3, creatinine 0.7. Urinalysis was within normal limits. Urine drug  screen was negative for enlisted drugs. Alcohol level was less than 5,  salicylate level less than 4, acetaminophen level was less than 10. Serum  ammonia was to the higher end of normal being  36.  Hepatic function panel  showed slightly abnormal levels with AST of 50, ALT of 25, alkaline  phosphatase 57, and total bilirubin of 0.9. Creatinine kinase and CK-MB done  was within normal limits.   RADIOLOGY DATA:  1.  The patient had a CT scan of head without contrast which showed chronic      small vessel disease and no acute intracranial abnormalities.  2.  Portable chest x-ray done on admission showed no acute  cardiopulmonary      findings, overall appearance stable since the last examination which was      done in July 2006.  3.  Blood culture times two sets were sent on admission which were negative.   HOSPITAL COURSE:   PROBLEM #1:  ALTERED MENTAL STATUS.  In light of history of alcohol dependence, the working diagnosis was mental  status changes secondary to alcohol withdrawal. It was not clear whether the  patient had an alcohol related seizure or not. The patient was admitted to  general  medical floor. Secondary to acute agitation he needed to  be  restrained. CT scan of his head was obtained with results as mentioned  above. He was started on thiamine and folate IV and p.r.n. dosages of Ativan  for his withdrawal symptoms. His initial ammonia level was 36, high end of  normal, suggesting some component of hepatic encephalopathy. Throughout the  hospital stay, the patient's mental status continued to improve. On the day  of discharge the patient was alert and oriented times three, answering  questions appropriately. He seemed back to his baseline. His girlfriend is  by the bedside. The patient discharged to home on tapering dosages of Ativan  to be taken over the next six days. Instructed not to drive while taking  Ativan. Also instructed to stop alcohol.   Throughout the hospital stay secondary to acute mental status changes, some  more lab tests were obtained, TSH was 1.147, RPR nonreactive. Vitamin B12  was 453, which is within normal range.   The patient was  also evaluated by neurology with further suggestions by  neurology were to place him on tapering doses of benzodiazepine and it was  felt that there was no need for further testing for his acute mental status  changes. It was felt that depression is contributing to his overall medical  problems as well.   PROBLEM #2:  Alcohol abuse. The patient was counseled, patient not motivated  to quit at all. The patient refused admission to a rehab program.   PROBLEM #3:  Hyponatremia on admission. His sodium on admission was 126.  With administration of normal saline this continued to improve and by the  day of discharge it was 135, which was within normal limits.   PROBLEM #4:  History of peptic ulcer disease. Throughout the hospital stay  the patient was continued on Protonix 40 mg daily. This was prescribed to  him upon discharge as well.   PROBLEM #5:  Possible depression. This was discussed with the patient.  Currently, with questionable compliance issues, patient not started on  antidepressant medications. The patient will need to find himself a primary  care physician and regularly follow up, once sure of his compliance with  medications, he can be started on antidepressants as an outpatient.  Therefore, the patient was not given any antidepressant prescriptions upon  discharge. This was discussed with the patient and his girlfriend in detail.   Social worker provided the patient with list of potential primary care  physicians. The patient seems to be motivated to follow up with the primary  care physician.   On the day of discharge the patient is back to his baseline mental status.  The patient discharged to home to follow up with primary care physician in  one week.           ______________________________  Edgar Vazquez, M.D.     PMJ/MEDQ  D:  04/23/2005  T:  04/23/2005  Job:  [id number]

## 2010-12-09 NOTE — H&P (Signed)
NAME:  Edgar Vazquez, LYFORD NO.:  [id number]   MEDICAL RECORD NO.:  [id number]          PATIENT TYPE:  INP   LOCATION:  5737                         FACILITY:  [facility]   PHYSICIAN:  Renato Battles, M.D.     DATE OF BIRTH:  1946-06-14   DATE OF ADMISSION:  04/19/2005  DATE OF DISCHARGE:                                HISTORY & PHYSICAL   PRIMARY CARE PHYSICIAN:  Unassigned.   REASON FOR ADMISSION:  Altered mental status.   HISTORY OF PRESENT ILLNESS:  The patient is a 65 year old white male for  whom somebody called EMS to come pick him up secondary to agitation and  confusion.  When EMS arrived on the scene, the patient was indeed agitated  and combative.  He had to be restrained and transferred to the emergency  room.  There was no seizure activity noted.  He was noted to be confused and  diaphoretic.  Did not indicate any chest pain or any other pain.  In the  emergency room, the patient made very quick recovery.  Mental status  improved tremendously.  He became calm and cooperative.  Attempts to contact  the home he was picked up from failed, no one would answer.  There is no  family around at this point.   The patient denies any pain.  He has no complaints except for being  restrained.   REVIEW OF SYSTEMS:  Unobtainable secondary to the patient's mental status.  He denies any complaints, but he would not go into details.  He would not  answer many questions.   PAST MEDICAL HISTORY:  1.  Alcoholism.  2.  History of peptic ulcer disease and gastritis.  3.  The patient was admitted once before recently in July of 2006 to this      hospital with alcohol intoxication and he was evaluated by a      psychiatrist the next day and was found to be competent.  He declined      detox treatment and left AMA the very next day.   PAST SURGICAL HISTORY:  Unknown.   FAMILY HISTORY:  Noncontributory, unknown.   SOCIAL HISTORY:  Positive history of tobacco and alcohol abuse.   Denies  drugs.  He says that at this time he takes very little alcohol and does not  comment any further.   ALLERGIES:  No known drug allergies.   MEDICATIONS:  None.   PHYSICAL EXAMINATION:  GENERAL:  Alert and oriented to self and place, not  to time.  VITAL SIGNS:  Temperature rectal 100.4, heart rate 130, respiratory rate 28,  blood pressure 156/112.  HEENT:  Normocephalic and atraumatic.  Pupils equal, round, and reactive to  light and accommodation.  Extraocular movements intact bilaterally.  NECK:  No lymphadenopathy, no thyromegaly, no JVD.  CHEST:  Clear to auscultation bilaterally.  No wheezing, rales, or rhonchi.  HEART:  Regular rhythm, tachycardia, no murmurs.  ABDOMEN:  Soft, nontender, and nondistended.  Normal active bowel sounds.  EXTREMITIES:  No cyanosis, clubbing, or edema.   STUDIES:  Electrolytes showed low  sodium at 126, normal electrolytes  otherwise.  Normal kidney function.  Ammonia was mildly elevated at 36.  UA  was negative.  Magnesium was normal.  Aspirin salicylate levels were  undetectable.  Alcohol level undetectable.  Drug screen was negative.  Liver  function showed low albumin at 3.2, but otherwise normal.  CPK was normal at  125.   Chest x-ray was negative.   ASSESSMENT:  1.  Altered mental status.  Working diagnosis at this point is alcohol      withdrawal/alcohol seizure.  Also in the differential is severe      dehydration.  At this time we will continue to keep the patient in      restraints.  We will try to wean him off restraints if his mental status      continued to improve and he posed no risks to himself or others.  We are      going to obtain head CT and CBC.  2.  Alcoholism.  The patient will receive thiamine folate as well as Ativan      p.r.n. for agitation.  Substance abuse education will be provided.  Case      manager consult also will be requested.  3.  Dehydration.  This will be treated with IV fluids.  4.  Hypernatremia.   Again, this is most likely secondary to fluid imbalance      secondary to dehydration.  I am going to give the patient some normal      saline, IV fluids and recheck sodium level in the morning.  5.  History of peptic ulcer disease and gastritis.  The patient will be on      Protonix.      Renato Battles, M.D.  Electronically Signed     SA/MEDQ  D:  04/19/2005  T:  04/20/2005  Job:  [id number]

## 2010-12-09 NOTE — H&P (Signed)
[place]  Patient:    Edgar Vazquez, Edgar Vazquez Visit Number: [id number] MRN: [id number]          Service Type: MED Location: [id number] Attending Physician:  Lonell Face Dictated by:   Oley Balm Georgina Pillion, M.D. Admit Date:  01/19/2002                           History and Physical  DATE OF BIRTH:  02-Aug-1945.  TIME OF ADMISSION:  10:50 p.m.  CHIEF COMPLAINT:  Disoriented, fever, anxiety for one day.  HISTORY OF PRESENT ILLNESS:  A 65 year old white male with no local medical doctor.  I was called in to admit patient because my partner, Dr. Jethro Bastos, was on "unassigned call."  I was called on this Sunday evening, January 19, 2002, to evaluate and admit the patient.  The patient is a longstanding alcoholic for over 30 years.  His last drink was about 48 hours ago when his girlfriend "kicked him out" from their house.  The patients brother, Luisa Hart, is here and provides most of the history. Patricks phone number is [phone].  He normally drinks at least eight beers per day perhaps more.  For the past 48 hours, he has been staying with his brother who has provided supportive care and the brother states he has not had any alcohol in the past 48 hours.  Today, there has been noted marked increase in anxiety, some sharp epigastric pain that is unrelieved with Tylenol.  He complains of some vague bilateral leg numbness that is intermittent.  Earlier today, he had some vague hallucinations.  No seizures.  He has had mild nausea and vomited yesterday and the day before but not today.  He tolerated a moderate amount of food today without vomiting.  Denies change of bowel habits, melena, or bright red blood per rectum.  Denies syncope or focal weakness.  Denies coryza or cough.  Yesterday, he had some minimally blood-tinged rhinorrhea but that has resolved.  He denies colored rhinorrhea or sore throat.  Admits to fevers and chills.  He has  had some sweating.  He is very vague about the duration of his epigastric pain.  He stated that it has been going on for years but perhaps worse in the past couple of weeks.  Does not radiate.  As noted above, he was disoriented and hallucinating earlier today but is not hallucinating now.  PAST MEDICAL HISTORY:  Longstanding alcoholism.  Brother states he was hospitalized at a [facility] March 07, 2001.  There was a question of an Agent Orange exposure in the past but I cannot verify this.  ALLERGIES:  No known drug allergies.  FAMILY HISTORY:  Brother states some time in the 74s.  He did have alcohol withdrawal seizures but not since then.  The brother states that the patient was treated at [facility] 12 years ago but has not been any other specific alcohol treatment programs.  CURRENT MEDICATIONS:  None.  PAST MEDICAL HISTORY:  He denies history of diabetes, heart disease, hypertension, or other chronic medical problems.  SOCIAL HISTORY:  Heavy alcohol use as per above.  [place] veteran.  He smokes a half a pack a day for many years.  Denies any illegal drugs.  Unemployed. Single.  As described above, he was recently "kicked out" of his girlfriends home 48 hours ago.  The patients brother, Luisa Hart, states that his girlfriend  is supplying him with a steady amount of beer to drink daily.  REVIEW OF SYSTEMS:  See HPI.  Denies chest pain or dyspnea.  No back pain. Denies any urinary symptoms.  Denies suicidal or homicidal ideation.  Admits to anxiety.  He denies depression or hallucinations at this time.  FAMILY HISTORY:  Father died of cerebral hemorrhage at age 63 and had an alcohol problem.  Mother alive at age 63 with hypertension.  Two brothers are both recovering alcoholic.  Both brothers have been sober for at least 12 years.  PHYSICAL EXAMINATION:  GENERAL:  Alert, fatigued, cooperative white male lying on the stretcher.  He could walk unaided but gait was  somewhat unsteady.  He was anxious, slightly confused.  Appeared very tremulous.  He was oriented to that he was in the hospital but felt this was [facility].  He correctly said this was Monday as this is Sunday.  He correctly said it was July as this is June.  He correctly answered his full name.  VITAL SIGNS:  Blood pressure 137/86, pulse 85, respiratory rate 22.  Pulse is regular.  Temperature 101.3.  O2 saturation 98% on room air.  HEENT:  Normocephalic and atraumatic.  Eyes are nonicteric.  EOMI.  PERRL. Ears show bilateral cerumen impaction.  Nose shows some mild nasal congestion but no bleeding, masses, or discharge.  Pharynx is clear.  Teeth with some poor dentition.  No exudate.  NECK:  Supple.  Nontender without thyromegaly or masses.  Carotids are 2+ and equal without bruits.  HEART:  Regular rate and rhythm without murmurs, rubs, or gallops.  LUNGS:  Clear.  ABDOMEN:  Soft.  Bowel sounds normoactive x 4.  The liver edge was percussed and palpated 3 cm below the right costal margin.  No other masses felt.  There was mild epigastric tenderness but no rebound tenderness.  GENITOURINARY:  Without masses or swelling.  RECTAL:  Per the physician assistant here in the ER was negative for masses. Brown heme negative stool on glove.  NEUROLOGICAL:  He had a very prominent resting tremor.  Appeared very tremulous.  Cranial nerves II-XII intact.  DTRs brisk, 2+ and equal at the biceps, Achilles, and patella reflexes bilaterally.  Motor is intact in the upper and lower extremities.  Sensation intact in the upper and lower extremities.  Cerebellar testing of the finger-to-nose test was very poor and could not sufficiently perform the finger-to-nose test.  SKIN:  Ruddy face without any other specific lesions.  Appeared warm and dry.  EXTREMITIES:  Without clubbing, cyanosis, or edema.  LABORATORY DATA:  Initial laboratory values show sodium 131, potassium  3.4, chloride 95, CO2 27, BUN 2, creatinine 0.8, glucose 125.  Hemoglobin 14.5 with  MCV 9, WBC 5.8, platelet count 83,000.  Lipase was 31 which is within normal limits.  Calcium 8.6, albumin 3.1.  AST mildly elevated at 112.  ALT mildly elevated at 59, and bilirubin mildly at 1.4.  Alkaline phosphatase normal at 57.  Serum alcohol level less than 5.  Chest x-ray showed no active disease.  IMPRESSION: 74. A 65 year old male with acute alcohol withdrawal, alcoholic. 2. Epigastric pain that may simply be from gastritis or could be peptic ulcer    disease or early pancreatitis. 3. History of tobacco abuse. 4. Hyponatremia. 5. Hypokalemia. 6. Mild dehydration.  PLAN:  We will admit to aggressively treat the alcohol withdrawal and hopefully prevent DTs.  Thiamine 100 mg IV was already given in the  ER.  IV fluids with potassium replacement.  We will follow electrolytes.  Lorazepam 1 to 2 mg IV q.4-6h.  We will give supportive care and frequent neurologic checks.  Although he has a fever that is most likely from the acute alcohol withdrawal and there is no evidence of any infection on clinical exam, we will still order blood cultures x 2.  Check UA and urine C&S.  Once he is medically stable, we will need placement in to some type of alcohol treatment program. Dictated by:   Oley Balm. Georgina Pillion, M.D. Attending Physician:  Lonell Face DD:  01/20/02 TD:  01/20/02 Job: [phone] [id number]

## 2011-05-04 LAB — CULTURE, BLOOD (ROUTINE X 2): Culture: NO GROWTH

## 2011-05-04 LAB — BLOOD GAS, ARTERIAL
Acid-Base Excess: 0.3
Acid-base deficit: 5 — ABNORMAL HIGH
Bicarbonate: 21.3
Bicarbonate: 22.4
Bicarbonate: 23.9
Bicarbonate: 24
Bicarbonate: 26.9 — ABNORMAL HIGH
Delivery systems: POSITIVE
Drawn by: 295541
FIO2: 0.21
FIO2: 0.21
FIO2: 1
FIO2: 1
O2 Content: 2
O2 Saturation: 95
O2 Saturation: 96.8
O2 Saturation: 97.6
O2 Saturation: 99.8
Patient temperature: 98.6
Patient temperature: 99
Patient temperature: 99.3
Patient temperature: 99.4
Pressure support: 10
TCO2: 17.4
TCO2: 19.2
TCO2: 20.7
pCO2 arterial: 38
pCO2 arterial: 59.2
pH, Arterial: 7.232 — ABNORMAL LOW
pH, Arterial: 7.411
pH, Arterial: 7.414
pH, Arterial: 7.416
pH, Arterial: 7.47 — ABNORMAL HIGH
pO2, Arterial: 130 — ABNORMAL HIGH
pO2, Arterial: 97.6
pO2, Arterial: 98.1

## 2011-05-04 LAB — PROTIME-INR
INR: 1.1
INR: 1.2
INR: 1.2
INR: 1.2
Prothrombin Time: 14.3
Prothrombin Time: 15.4 — ABNORMAL HIGH

## 2011-05-04 LAB — CBC
HCT: 44
HCT: 57.5 — ABNORMAL HIGH
Hemoglobin: 14.6
Hemoglobin: 15.1
Hemoglobin: 15.8
Hemoglobin: 19.3 — ABNORMAL HIGH
MCHC: 33.5
MCHC: 33.6
MCHC: 34.2
MCV: 94.9
MCV: 95.9
Platelets: 367
Platelets: 421 — ABNORMAL HIGH
RBC: 4.5
RBC: 4.64
RDW: 13.8
RDW: 13.9
RDW: 14.2 — ABNORMAL HIGH
RDW: 14.2 — ABNORMAL HIGH
WBC: 6.3

## 2011-05-04 LAB — COMPREHENSIVE METABOLIC PANEL
ALT: 35
AST: 29
Albumin: 1.9 — ABNORMAL LOW
Chloride: 103
Creatinine, Ser: 0.93
GFR calc Af Amer: >60
Potassium: 4.9
Sodium: 134 — ABNORMAL LOW
Total Bilirubin: 0.8

## 2011-05-04 LAB — URINALYSIS, ROUTINE W REFLEX MICROSCOPIC
Glucose, UA: NEGATIVE
Leukocytes, UA: NEGATIVE
Specific Gravity, Urine: 1.005
pH: 6.5

## 2011-05-04 LAB — DIFFERENTIAL
Basophils Absolute: 0
Basophils Absolute: 0
Basophils Absolute: 0
Basophils Absolute: 0.1
Basophils Relative: 1
Eosinophils Absolute: 0.1
Eosinophils Relative: 0
Eosinophils Relative: 1
Eosinophils Relative: 3
Lymphocytes Relative: 14
Lymphocytes Relative: 16
Lymphocytes Relative: 6 — ABNORMAL LOW
Monocytes Absolute: 0.5
Monocytes Absolute: 0.6
Monocytes Absolute: 0.6
Monocytes Absolute: 0.6
Monocytes Relative: 10
Monocytes Relative: 11
Monocytes Relative: 3
Neutro Abs: 4.6

## 2011-05-04 LAB — MAGNESIUM
Magnesium: 1.7
Magnesium: 2

## 2011-05-04 LAB — BASIC METABOLIC PANEL
BUN: 1 — ABNORMAL LOW
BUN: 6
CO2: 25
CO2: 29
Calcium: 8.3 — ABNORMAL LOW
Calcium: 9.1
Calcium: 9.4
Chloride: 101
Chloride: 108
Creatinine, Ser: 0.72
Creatinine, Ser: 0.73
GFR calc Af Amer: >60
GFR calc Af Amer: >60
GFR calc non Af Amer: >60
GFR calc non Af Amer: >60
GFR calc non Af Amer: >60
Glucose, Bld: 100 — ABNORMAL HIGH
Glucose, Bld: 101 — ABNORMAL HIGH
Glucose, Bld: 107 — ABNORMAL HIGH
Glucose, Bld: 112 — ABNORMAL HIGH
Potassium: 3.7
Potassium: 3.9
Potassium: 4.7
Sodium: 135
Sodium: 136
Sodium: 138
Sodium: 139

## 2011-05-04 LAB — CARDIAC PANEL(CRET KIN+CKTOT+MB+TROPI)
CK, MB: 1.7
CK, MB: 3.5
Relative Index: INVALID
Total CK: 26
Troponin I: 0.06

## 2011-05-04 LAB — URINE MICROSCOPIC-ADD ON

## 2011-05-04 LAB — HEPATIC FUNCTION PANEL
ALT: 53
ALT: 55 — ABNORMAL HIGH
AST: 56 — ABNORMAL HIGH
Bilirubin, Direct: 0.1
Bilirubin, Direct: 0.1
Bilirubin, Direct: 0.2
Indirect Bilirubin: 0.6
Indirect Bilirubin: 0.7
Total Bilirubin: 0.8
Total Bilirubin: 0.8

## 2011-05-04 LAB — STOOL CULTURE

## 2011-05-04 LAB — LACTIC ACID, PLASMA: Lactic Acid, Venous: 1.6

## 2011-05-04 LAB — PHOSPHORUS: Phosphorus: 4

## 2011-05-04 LAB — APTT
aPTT: 22 — ABNORMAL LOW
aPTT: 32
aPTT: 33
aPTT: 33

## 2011-05-04 LAB — AMYLASE: Amylase: 75

## 2011-05-04 LAB — LIPASE, BLOOD: Lipase: 21

## 2011-05-04 LAB — OVA AND PARASITE EXAMINATION: Ova and parasites: NONE SEEN

## 2011-05-04 LAB — B-NATRIURETIC PEPTIDE (CONVERTED LAB)
Pro B Natriuretic peptide (BNP): 283 — ABNORMAL HIGH
Pro B Natriuretic peptide (BNP): 621 — ABNORMAL HIGH

## 2011-05-04 LAB — FECAL LACTOFERRIN, QUANT: Fecal Lactoferrin: POSITIVE

## 2011-05-04 LAB — CLOSTRIDIUM DIFFICILE EIA: C difficile Toxins A+B, EIA: NEGATIVE

## 2011-05-05 LAB — CBC
HCT: 49.6
HCT: 56 — ABNORMAL HIGH
Platelets: 270
Platelets: 281
RBC: 5.17
RBC: 5.82 — ABNORMAL HIGH
WBC: 5.7
WBC: 7.5

## 2011-05-05 LAB — COMPREHENSIVE METABOLIC PANEL
AST: 55 — ABNORMAL HIGH
Albumin: 2.6 — ABNORMAL LOW
Alkaline Phosphatase: 56
BUN: 5 — ABNORMAL LOW
CO2: 26
Chloride: 95 — ABNORMAL LOW
GFR calc Af Amer: >60
GFR calc non Af Amer: >60
Potassium: 4.4
Total Bilirubin: 0.7

## 2011-05-05 LAB — DIFFERENTIAL
Basophils Absolute: 0.1
Basophils Relative: 2 — ABNORMAL HIGH
Eosinophils Absolute: 0.1
Eosinophils Relative: 1
Monocytes Absolute: 0.8 — ABNORMAL HIGH

## 2011-05-05 LAB — URINALYSIS, ROUTINE W REFLEX MICROSCOPIC
Bilirubin Urine: NEGATIVE
Leukocytes, UA: NEGATIVE
Nitrite: POSITIVE — AB
Specific Gravity, Urine: 1.018
Urobilinogen, UA: 1
pH: 5.5

## 2011-05-05 LAB — TSH: TSH: 1.794

## 2011-05-05 LAB — PHOSPHORUS: Phosphorus: 3.4

## 2011-05-05 LAB — URINE MICROSCOPIC-ADD ON

## 2011-05-05 LAB — ETHANOL: Alcohol, Ethyl (B): <5

## 2011-05-05 LAB — RAPID URINE DRUG SCREEN, HOSP PERFORMED
Barbiturates: NOT DETECTED
Benzodiazepines: NOT DETECTED

## 2011-05-05 LAB — BASIC METABOLIC PANEL
BUN: 4 — ABNORMAL LOW
GFR calc Af Amer: >60
GFR calc non Af Amer: >60
Potassium: 3.7

## 2011-05-05 LAB — MAGNESIUM: Magnesium: 2.2

## 2011-05-05 LAB — HEPATITIS C ANTIBODY: HCV Ab: NEGATIVE

## 2011-05-05 LAB — SEDIMENTATION RATE: Sed Rate: 7

## 2011-05-21 ENCOUNTER — Encounter: Payer: Self-pay | Admitting: Family Medicine

## 2011-05-25 ENCOUNTER — Ambulatory Visit: Payer: Self-pay | Admitting: Family Medicine

## 2011-05-25 DIAGNOSIS — Z0289 Encounter for other administrative examinations: Secondary | ICD-10-CM

## 2012-01-14 ENCOUNTER — Inpatient Hospital Stay (HOSPITAL_COMMUNITY)
Admission: EM | Admit: 2012-01-14 | Discharge: 2012-01-23 | DRG: 918 | Disposition: A | Discharge status: Skilled Nursing Facility | Payer: Medicaid Other | Attending: Family Medicine | Admitting: Family Medicine

## 2012-01-14 ENCOUNTER — Encounter (HOSPITAL_COMMUNITY): Payer: Self-pay | Admitting: Emergency Medicine

## 2012-01-14 DIAGNOSIS — E8809 Other disorders of plasma-protein metabolism, not elsewhere classified: Secondary | ICD-10-CM | POA: Diagnosis present

## 2012-01-14 DIAGNOSIS — E236 Other disorders of pituitary gland: Secondary | ICD-10-CM | POA: Diagnosis present

## 2012-01-14 DIAGNOSIS — E876 Hypokalemia: Secondary | ICD-10-CM | POA: Diagnosis present

## 2012-01-14 DIAGNOSIS — D759 Disease of blood and blood-forming organs, unspecified: Secondary | ICD-10-CM

## 2012-01-14 DIAGNOSIS — T46904A Poisoning by unspecified agents primarily affecting the cardiovascular system, undetermined, initial encounter: Principal | ICD-10-CM | POA: Diagnosis present

## 2012-01-14 DIAGNOSIS — F1020 Alcohol dependence, uncomplicated: Secondary | ICD-10-CM | POA: Diagnosis present

## 2012-01-14 DIAGNOSIS — I959 Hypotension, unspecified: Secondary | ICD-10-CM

## 2012-01-14 DIAGNOSIS — F0390 Unspecified dementia without behavioral disturbance: Secondary | ICD-10-CM | POA: Diagnosis present

## 2012-01-14 DIAGNOSIS — E46 Unspecified protein-calorie malnutrition: Secondary | ICD-10-CM

## 2012-01-14 DIAGNOSIS — IMO0002 Reserved for concepts with insufficient information to code with codable children: Secondary | ICD-10-CM

## 2012-01-14 DIAGNOSIS — F1910 Other psychoactive substance abuse, uncomplicated: Secondary | ICD-10-CM | POA: Diagnosis present

## 2012-01-14 DIAGNOSIS — T50992A Poisoning by other drugs, medicaments and biological substances, intentional self-harm, initial encounter: Secondary | ICD-10-CM | POA: Diagnosis present

## 2012-01-14 DIAGNOSIS — Y92009 Unspecified place in unspecified non-institutional (private) residence as the place of occurrence of the external cause: Secondary | ICD-10-CM

## 2012-01-14 DIAGNOSIS — R599 Enlarged lymph nodes, unspecified: Secondary | ICD-10-CM | POA: Diagnosis present

## 2012-01-14 DIAGNOSIS — R918 Other nonspecific abnormal finding of lung field: Secondary | ICD-10-CM

## 2012-01-14 DIAGNOSIS — R5381 Other malaise: Secondary | ICD-10-CM

## 2012-01-14 DIAGNOSIS — T50901A Poisoning by unspecified drugs, medicaments and biological substances, accidental (unintentional), initial encounter: Secondary | ICD-10-CM

## 2012-01-14 DIAGNOSIS — I426 Alcoholic cardiomyopathy: Secondary | ICD-10-CM

## 2012-01-14 DIAGNOSIS — F1010 Alcohol abuse, uncomplicated: Secondary | ICD-10-CM

## 2012-01-14 DIAGNOSIS — I10 Essential (primary) hypertension: Secondary | ICD-10-CM | POA: Diagnosis present

## 2012-01-14 DIAGNOSIS — E871 Hypo-osmolality and hyponatremia: Secondary | ICD-10-CM | POA: Diagnosis present

## 2012-01-14 DIAGNOSIS — Z880 Allergy status to penicillin: Secondary | ICD-10-CM

## 2012-01-14 DIAGNOSIS — K5730 Diverticulosis of large intestine without perforation or abscess without bleeding: Secondary | ICD-10-CM | POA: Diagnosis present

## 2012-01-14 DIAGNOSIS — F17200 Nicotine dependence, unspecified, uncomplicated: Secondary | ICD-10-CM

## 2012-01-14 LAB — CBC
HCT: 42.2 % (ref 39.0–52.0)
Platelets: 244 10*3/uL (ref 150–400)
RBC: 4.6 MIL/uL (ref 4.22–5.81)
RDW: 13.6 % (ref 11.5–15.5)
WBC: 6.4 10*3/uL (ref 4.0–10.5)

## 2012-01-14 LAB — COMPREHENSIVE METABOLIC PANEL
AST: 15 U/L (ref 0–37)
Albumin: 3.7 g/dL (ref 3.5–5.2)
Alkaline Phosphatase: 95 U/L (ref 39–117)
Chloride: 90 mEq/L — ABNORMAL LOW (ref 96–112)
Potassium: 2.6 mEq/L — CL (ref 3.5–5.1)
Total Bilirubin: 0.5 mg/dL (ref 0.3–1.2)

## 2012-01-14 LAB — RAPID URINE DRUG SCREEN, HOSP PERFORMED
Amphetamines: NOT DETECTED
Barbiturates: NOT DETECTED
Benzodiazepines: NOT DETECTED
Tetrahydrocannabinol: NOT DETECTED

## 2012-01-14 MED ORDER — POTASSIUM CHLORIDE 10 MEQ/100ML IV SOLN
10.0000 meq | INTRAVENOUS | Status: AC
  Administered 2012-01-14 – 2012-01-15 (×6): 10 meq via INTRAVENOUS
  Filled 2012-01-14: qty 500
  Filled 2012-01-14: qty 100

## 2012-01-14 NOTE — ED Provider Notes (Signed)
History     CSN: [id number]  Arrival date & time 01/14/12  2205   First MD Initiated Contact with Patient 01/14/12 2300      Chief Complaint  Patient presents with  . Ingestion    (Consider location/radiation/quality/duration/timing/severity/associated sxs/prior treatment) HPI The patient presents after a possible ingestion episode.  The patient only notes that his remake called paramedics for him to be evaluated due to interpersonal differences.  The patient denies any ingestion, thoughts of hurting himself, pain, lightheadedness, dyspnea or any focal complaints at all.  He states that he has been in his usual state of health, does not want to be evaluated.  He states that although it is reported that he has been taking medications that are not his, this is untrue.  Per EMS the patient possibly took between 1 and 58 tablets of 10 mg of lisinopril.    History reviewed. No pertinent past medical history.  Past Surgical History  Procedure Date  . Ankle surgery     No family history on file.  History  Substance Use Topics  . Smoking status: Current Everyday Smoker  . Smokeless tobacco: Not on file  . Alcohol Use: Yes     occasional      Review of Systems  Constitutional: Negative for fever and chills.  HENT: Negative.   Eyes: Negative.   Respiratory: Negative for chest tightness and shortness of breath.   Cardiovascular: Negative for chest pain.  Gastrointestinal: Negative for nausea and vomiting.  Genitourinary: Negative.   Musculoskeletal: Negative.   Skin: Negative.   Neurological: Negative for light-headedness and headaches.  Psychiatric/Behavioral: Negative for suicidal ideas and self-injury. The patient is not nervous/anxious.     Allergies  Penicillins  Home Medications   Current Outpatient Rx  Name Route Sig Dispense Refill  . ALBUTEROL SULFATE HFA 108 (90 BASE) MCG/ACT IN AERS Inhalation Inhale 2 puffs into the lungs every 6 (six) hours as needed.        Marland Kitchen CLONIDINE HCL 0.2 MG/24HR TD PTWK Transdermal Place 1 patch onto the skin once a week.      Marland Kitchen FOLIC ACID 1 MG PO TABS Oral Take 1 mg by mouth daily.      Marland Kitchen OMEPRAZOLE 20 MG PO CPDR Oral Take 20 mg by mouth 2 (two) times daily.      . SERTRALINE HCL 50 MG PO TABS Oral Take 50 mg by mouth daily.      . THIAMINE HCL 100 MG PO TABS Oral Take 100 mg by mouth daily.        BP 80/54  Pulse 76  Temp 98 F (36.7 C) (Oral)  Resp 16  Ht 5\' 11"  (1.803 m)  Wt 160 lb (72.576 kg)  BMI 22.32 kg/m2  SpO2 100%  Physical Exam  Nursing note and vitals reviewed. Constitutional: He is oriented to person, place, and time. He appears well-developed and well-nourished. No distress.  HENT:  Head: Normocephalic and atraumatic. No trismus in the jaw.  Mouth/Throat: Oropharynx is clear and moist and mucous membranes are normal. No oral lesions. Abnormal dentition. Dental caries present. No dental abscesses or uvula swelling.  Cardiovascular: Normal rate and regular rhythm.   Pulmonary/Chest: Effort normal and breath sounds normal. No stridor. No respiratory distress.  Abdominal: Soft. He exhibits no distension.  Musculoskeletal: He exhibits no edema.  Neurological: He is alert and oriented to person, place, and time. No cranial nerve deficit. Coordination normal.  Psychiatric: He has a normal mood and  affect. His behavior is normal. His speech is not delayed, not tangential and not slurred. Thought content is delusional. He expresses no homicidal and no suicidal ideation. He expresses no suicidal plans and no homicidal plans.    ED Course  Procedures (including critical care time)   Labs Reviewed  CBC  URINE RAPID DRUG SCREEN (HOSP PERFORMED)  COMPREHENSIVE METABOLIC PANEL  ETHANOL  ACETAMINOPHEN LEVEL   No results found.   No diagnosis found.  Cardiac: 75 sr normal  Pulse ox 100% ra, [place], abnormal   Date: 01/15/2012  Rate: 80  Rhythm: normal sinus rhythm  QRS Axis: normal  Intervals: QT  prolonged  ST/T Wave abnormalities: normal  Conduction Disutrbances:none  Narrative Interpretation:   Old EKG Reviewed: changes noted ABNORMAL   Immediately after the patient's arrival, Poison Control was contacted.  They note the likely height of activity of the lisinopril at ~8hr post-ingestion.  The patient also received double access for 2L NS.    The patient's BP was inconsistently normal vs. Hypotensive, and he was sleeping.  He awakened easily.  MDM  This 66 year old male presents after a likely overdose.  Notably, on exam the patient denies any such ingestion, though per EMS report, nor up to 58 lisinopril tablets at the patient's residence.  Given the patient's hypotension soon after his arrival, this seems a legitimate concern.  The patient's denial of any complaints, ingestion, history of medical issues come to the evaluation somewhat, but he is in no distress, though he is hypotensive throughout the initial evaluation.  Immediately after the patient's arrival the patient had 2 IV access points provided, and resuscitation was commenced with normal saline.  We discussed the case with poison control.  Poison control notes that the likely time of maximum lisinopril effect would be approximately 8 hours after ingestion.  The patient's blood pressure improved mildly, though inconsistently with IV fluids.  He did however not decompensate.  Given the ongoing medication effects, though the patient needs a psychiatric evaluation, he will be admitted to a step down unit for further medical evaluation and management during this acute phase of his overdose.  Notably, the patient's initial labs were notable for the absence of any notable findings on his tox screen, but he did have notable hypokalemia, which per poison control is one of the competitions of lisinopril overdose.  CRITICAL CARE Performed by: Gerhard Munch   Total critical care time: 35  Critical care time was exclusive of  separately billable procedures and treating other patients.  Critical care was necessary to treat or prevent imminent or life-threatening deterioration.  Critical care was time spent personally by me on the following activities: development of treatment plan with patient and/or surrogate as well as nursing, discussions with consultants, evaluation of patient's response to treatment, examination of patient, obtaining history from patient or surrogate, ordering and performing treatments and interventions, ordering and review of laboratory studies, ordering and review of radiographic studies, pulse oximetry and re-evaluation of patient's condition.         Gerhard Munch, MD 01/15/12 [phone]

## 2012-01-14 NOTE — ED Notes (Signed)
Sitter at bedside for safety.

## 2012-01-14 NOTE — ED Notes (Signed)
Per EMS pt transported from home, room mate reported pt took several of room mates Lisinopril to "make him angry". Per EMS 58 Lisinopril 100mg  missing from bottle, pt admits taking 1. Will not admit where the other pills are. Pt is resistive to care by EMS and ED staff.

## 2012-01-14 NOTE — ED Notes (Signed)
ZOX:WRUE<AV> Expected date:<BR> Expected time:10:10 PM<BR> Means of arrival:<BR> Comments:<BR> M241 - 74yoM Took ?48 lisinopril to &quot;make his roommate angry&quot;

## 2012-01-14 NOTE — ED Notes (Signed)
Pt states that he was mad at his roommate, so he took one of his roomate's Lisiniopril. Pt denies taking more than 1. He indicated he had done something with the remaining pills but would not say what that was. Pt denies that he had intentions of harming himself but that he took the Lisinipril only to upset his roommate.

## 2012-01-15 DIAGNOSIS — E876 Hypokalemia: Secondary | ICD-10-CM

## 2012-01-15 DIAGNOSIS — F1910 Other psychoactive substance abuse, uncomplicated: Secondary | ICD-10-CM

## 2012-01-15 LAB — COMPREHENSIVE METABOLIC PANEL
BUN: <3 mg/dL — ABNORMAL LOW (ref 6–23)
CO2: 25 mEq/L (ref 19–32)
Chloride: 95 mEq/L — ABNORMAL LOW (ref 96–112)
Creatinine, Ser: 0.71 mg/dL (ref 0.50–1.35)
GFR calc Af Amer: >90 mL/min (ref >90)
GFR calc non Af Amer: >90 mL/min (ref >90)
Glucose, Bld: 116 mg/dL — ABNORMAL HIGH (ref 70–99)
Total Bilirubin: 0.3 mg/dL (ref 0.3–1.2)

## 2012-01-15 LAB — CARDIAC PANEL(CRET KIN+CKTOT+MB+TROPI)
CK, MB: 5 ng/mL — ABNORMAL HIGH (ref 0.3–4.0)
CK, MB: 5.5 ng/mL — ABNORMAL HIGH (ref 0.3–4.0)
CK, MB: 5.9 ng/mL — ABNORMAL HIGH (ref 0.3–4.0)
Relative Index: 3.8 — ABNORMAL HIGH (ref 0.0–2.5)
Total CK: 130 U/L (ref 7–232)
Troponin I: <0.3 ng/mL (ref <0.30)

## 2012-01-15 LAB — GLUCOSE, CAPILLARY: Glucose-Capillary: 96 mg/dL (ref 70–99)

## 2012-01-15 LAB — DIFFERENTIAL
Basophils Absolute: 0 10*3/uL (ref 0.0–0.1)
Eosinophils Absolute: 0.1 10*3/uL (ref 0.0–0.7)
Eosinophils Relative: 2 % (ref 0–5)

## 2012-01-15 LAB — PRO B NATRIURETIC PEPTIDE: Pro B Natriuretic peptide (BNP): 181.7 pg/mL — ABNORMAL HIGH (ref 0–125)

## 2012-01-15 LAB — PROTIME-INR
INR: 1.18 (ref 0.00–1.49)
Prothrombin Time: 15.2 seconds (ref 11.6–15.2)

## 2012-01-15 LAB — CBC
HCT: 40 % (ref 39.0–52.0)
MCV: 93.2 fL (ref 78.0–100.0)
RBC: 4.29 MIL/uL (ref 4.22–5.81)
RDW: 13.6 % (ref 11.5–15.5)
WBC: 5.7 10*3/uL (ref 4.0–10.5)

## 2012-01-15 LAB — TSH: TSH: 1.539 u[IU]/mL (ref 0.350–4.500)

## 2012-01-15 LAB — HEMOGLOBIN A1C
Hgb A1c MFr Bld: 5.7 % — ABNORMAL HIGH (ref <5.7)
Mean Plasma Glucose: 117 mg/dL — ABNORMAL HIGH (ref <117)

## 2012-01-15 LAB — APTT: aPTT: 30 seconds (ref 24–37)

## 2012-01-15 LAB — PHOSPHORUS: Phosphorus: 3 mg/dL (ref 2.3–4.6)

## 2012-01-15 LAB — MAGNESIUM: Magnesium: 1.6 mg/dL (ref 1.5–2.5)

## 2012-01-15 MED ORDER — SODIUM CHLORIDE 0.9 % IJ SOLN
3.0000 mL | Freq: Two times a day (BID) | INTRAMUSCULAR | Status: DC
  Administered 2012-01-15: 10 mL via INTRAVENOUS
  Administered 2012-01-15 – 2012-01-22 (×13): 3 mL via INTRAVENOUS

## 2012-01-15 MED ORDER — ENOXAPARIN SODIUM 40 MG/0.4ML ~~LOC~~ SOLN
40.0000 mg | SUBCUTANEOUS | Status: DC
  Administered 2012-01-15 – 2012-01-23 (×8): 40 mg via SUBCUTANEOUS
  Filled 2012-01-15 (×9): qty 0.4

## 2012-01-15 MED ORDER — SODIUM CHLORIDE 0.9 % IV SOLN
INTRAVENOUS | Status: DC
  Administered 2012-01-15 (×2): via INTRAVENOUS

## 2012-01-15 MED ORDER — CLONIDINE HCL 0.2 MG/24HR TD PTWK
0.2000 mg | MEDICATED_PATCH | TRANSDERMAL | Status: DC
  Administered 2012-01-15 – 2012-01-22 (×2): 0.2 mg via TRANSDERMAL
  Filled 2012-01-15 (×2): qty 1

## 2012-01-15 MED ORDER — ONDANSETRON HCL 4 MG/2ML IJ SOLN: 4.0000 mg | Freq: Four times a day (QID) | INTRAMUSCULAR | Status: DC | PRN

## 2012-01-15 MED ORDER — ALBUTEROL SULFATE HFA 108 (90 BASE) MCG/ACT IN AERS
2.0000 | INHALATION_SPRAY | Freq: Four times a day (QID) | RESPIRATORY_TRACT | Status: DC | PRN
  Filled 2012-01-15: qty 6.7

## 2012-01-15 MED ORDER — ONDANSETRON HCL 4 MG PO TABS: 4.0000 mg | ORAL_TABLET | Freq: Four times a day (QID) | ORAL | Status: DC | PRN

## 2012-01-15 MED ORDER — HYDRALAZINE HCL 10 MG PO TABS
10.0000 mg | ORAL_TABLET | Freq: Three times a day (TID) | ORAL | Status: DC | PRN
  Administered 2012-01-15: 10 mg via ORAL
  Filled 2012-01-15 (×2): qty 1

## 2012-01-15 MED ORDER — POTASSIUM CHLORIDE CRYS ER 20 MEQ PO TBCR
40.0000 meq | EXTENDED_RELEASE_TABLET | Freq: Two times a day (BID) | ORAL | Status: DC
  Administered 2012-01-15 – 2012-01-19 (×7): 40 meq via ORAL
  Filled 2012-01-15 (×10): qty 2

## 2012-01-15 NOTE — Consult Note (Addendum)
Patient Identification:  HAIDAN NHAN Date of Evaluation:  01/15/2012 Reason for Consult: Intentional overdose vs. overdose  Referring Provider: Dr. Gonzella Lex   History of Present Illness:Pt says the woman's son uses drugs.  He went into  the son's room and claims they accused him of taking the son's pills  Past Psychiatric History:   unknown;  Pt is unable to provide past history  Past Medical History:e with woman, [name], and her grown son.  He claims the son blames him for taking medications after pt found pills in the room     History reviewed. No pertinent past medical history.     Past Surgical History  Procedure Date  . Ankle surgery     Allergies:  Allergies  Allergen Reactions  . Penicillins     Current Medications:  Prior to Admission medications   Not on File    Social History:    reports that he has been smoking.  He does not have any smokeless tobacco history on file. He reports that he drinks alcohol. He reports that he does not use illicit drugs.   Family History:    No family history on file.  Mental Status Examination/Evaluation: Objective:  Appearance: Disheveled and appears neglected and undernourished  Psychomotor Activity:  Decreased  Eye Contact::  Fair  Speech:  very limited usully "don't know'  Volume:  Decreased  Mood:  Dysphoric angry  Affect:  Blunt and Depressed  Thought Process:  Relevant, Irrelevant, Disorganized and rumination about the singular incident; perseveration about the 'false claim'  Orientation:  Other:  Not oriented to date, location, situation ; just name; knows he is in a hospital  Thought Content:  Delusions  Suicidal Thoughts:  No  Homicidal Thoughts:  No  Judgement:  Poor  Insight:  Lacking    DIAGNOSIS:   AXIS I   Dementia; Alcohol Dependence r/o  benign familial tremor vs alcohol tremor  AXIS II  Deffered  AXIS III See medical notes.  AXIS IV economic problems, educational problems, housing problems, other  psychosocial or environmental problems, problems related to social environment, problems with access to health care services and problems with primary support group  AXIS V 61-70 mild symptoms   Assessment/Plan: Discussed with Dr. Glennis Brink Pt is wearing a baseball cap in bed.  He has intermittent eye contact.  He repeats constantly that he accused Lorraine Lax, son of using drugs and says they in [place] accuse him.  He claims [name] always protects son.  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 He has a son and daughter.  He has know [name] since HS and recently move in to share a house.  He says they all cook what they want to eat.  He denies alcohol use but alcohol is on his problem list.  Statement by [name] is that he is always demanding cigarettes -which he also denied. And the pill OD was an impulsive act when she said he could not get cigarettes.  He is very alert but says very little.  He participates in Mental Status Examination: He does not know the date, nor hospital name,  He is able to describe the concept of One in the Hand is worth Two in the Bush in abstract terms.  He has good concentration in calculating serial 7s.  He cannot recall 3 objects in 5 minutes.  He glaring deficit is short term; working [employer] fast streamer.  He is also  in denial about alcohol and tobacco.  During this evaluation he has hands one over the other and the fine tremor persists at rest.  He is asked, since he is so aggravated at [place] and her son.  If he would like to be in a facility of assisted living.  He says 'no'.  His chemistry suggests he is undernourished with very low albumen and elevated LFTs.  He might benefit from being in a facility for medical and nutritional reasons as well as avoiding the apparent, mutual irritation of the current interaction among the three roommates.  RECOMMENDATION:  1.  This patient surprisingly has capacity to make decisions; perhaps with more  extensive testing general deficits may be revealed 2.  He also has selective dementia, impaired short term memory causing perseveration of topics and inability in that respect to follow medical care; in fact he seems unimpressed with the need to have a PCP 3.  Consider use of Namenda 10 mg twice daily 4. Referral to PCP when medically stable 5. Re-visit option of SNF for PT rehabilitation for stability.  Nakshatra Klose J. Ferol Luz, MD Psychiatrist  01/15/2012 8:35 PM

## 2012-01-15 NOTE — Progress Notes (Addendum)
Subjective: Patient informs that he was unnecessarily sent to the hospital by the people whom he lives with. He lives with a woman and her son who is on lisinopril. He informs that when he asked the son why he was on those meds he called EMS  And charged him of taking all the pills. On questioning the lady whom he lives with she has a different story to tell. She informs that he is "crazy". He is craving for cigarettes all the time, abusing people and yesterday when he couldn't get the cigarettes he grabbed her son's bottle of lisinopril and swallowed the tablets. She does not know how many.  Patient stable in tele except for occasional episodes of HR in 40s overnight.   Objective:  Vital signs in last 24 hours:  Filed Vitals:   01/15/12 0400 01/15/12 0630 01/15/12 0800 01/15/12 1200  BP:  136/80 159/91 174/92  Pulse:  62 44 68  Temp: 96.8 F (36 C)  97.4 F (36.3 C) 97.5 F (36.4 C)  TempSrc: Oral  Oral Oral  Resp:  17 18 15   Height:      Weight:      SpO2:  100% 98% 100%    Intake/Output from previous day:   Intake/Output Summary (Last 24 hours) at 01/15/12 1638 Last data filed at 01/15/12 1551  Gross per 24 hour  Intake 5688.75 ml  Output   7025 ml  Net -1336.25 ml    Physical Exam:  General: elderly male  in no acute distress. HEENT: no pallor, no icterus, moist oral mucosa, no JVD, no lymphadenopathy Heart: Normal  s1 &s2  Regular rate and rhythm, without murmurs, rubs, gallops. Lungs: Clear to auscultation bilaterally. Abdomen: Soft, nontender, nondistended, positive bowel sounds. Extremities: No clubbing cyanosis or edema with positive pedal pulses. Neuro: Alert, awake, oriented x3, fine tremors of right hand. Flat affect   Lab Results:  Basic Metabolic Panel:    Component Value Date/Time   NA 129* 01/15/2012 0345   K 3.1* 01/15/2012 0345   CL 95* 01/15/2012 0345   CO2 25 01/15/2012 0345   BUN <3* 01/15/2012 0345   CREATININE 0.71 01/15/2012 0345   GLUCOSE  116* 01/15/2012 0345   CALCIUM 8.5 01/15/2012 0345   CBC:    Component Value Date/Time   WBC 5.7 01/15/2012 0345   HGB 13.0 01/15/2012 0345   HCT 40.0 01/15/2012 0345   PLT 234 01/15/2012 0345   MCV 93.2 01/15/2012 0345   NEUTROABS 3.7 01/15/2012 0345   LYMPHSABS 1.3 01/15/2012 0345   MONOABS 0.5 01/15/2012 0345   EOSABS 0.1 01/15/2012 0345   BASOSABS 0.0 01/15/2012 0345    Recent Results (from the past 240 hour(s))  MRSA PCR SCREENING     Status: Normal   Collection Time   01/15/12  2:31 AM      Component Value Range Status Comment   MRSA by PCR NEGATIVE  NEGATIVE Final     Studies/Results: No results found.  Medications: Scheduled Meds:   . enoxaparin  40 mg Subcutaneous Q24H  . potassium chloride  10 mEq Intravenous Q1 Hr x 6  . potassium chloride  40 mEq Oral BID  . sodium chloride  3 mL Intravenous Q12H   Continuous Infusions:   . sodium chloride 125 mL/hr at 01/15/12 1536   PRN Meds:.ondansetron (ZOFRAN) IV, ondansetron  Assessment/Plan:  *Lisinopril ingestion  -unclear how many pills he took. He is monitored n stepdown. Stable on tele  except for few episodes  of  HR in 40s. Elevated BP noted but stable -i am informed by Ms Eyvonne Mechanic whom he lives with that he has irrational behavior, abusing , cursing and being agitated time to time. She does not want him back in the house  - per poison control half life of lisinopril is about 8 hours  - alcohol level is within normal limits  - Utox is negative  -psych consult to access for competency -PT eval recommends SNF  Hypokalemia and hyponatremia No clear explanation Patient should be hyperkalemic than hypo with lisinopril toxicity  k being replenished. Cont IV fluids . For hyponatremia. Will check urine sodium and osm   HTN  on  clonidine patch, will continue Add hydralazone  Hx of etoh cardiomyopathy Denies any etoh use currently  Tobacco abuse counseled on smoking cessation  DVT prophylaxis  sq  LOVENOX  FULL CODE   LOS: 1 day   Edgar Vazquez 01/15/2012, 4:38 PM

## 2012-01-15 NOTE — ED Notes (Signed)
Report called to ICU RN.

## 2012-01-15 NOTE — Evaluation (Signed)
Physical Therapy Evaluation Patient Details Name: Edgar Vazquez MRN: [id number] DOB: 07-13-1946 Today's Date: 01/15/2012 Time: 7829-5621 PT Time Calculation (min): 9 min  PT Assessment / Plan / Recommendation Clinical Impression  Pt admitted for ingestion of approximately 60 pills of lisinopril.  Pt impulsive and with decreased safety awareness.  Pt required mod assist for a few feet of ambulation due to balance.  Pt would benefit from acute PT services in order to improve safety and independence with transfers and ambulation to prepare for d/c to next venue.    PT Assessment  Patient needs continued PT services    Follow Up Recommendations  Supervision/Assistance - 24 hour;Skilled nursing facility    Barriers to Discharge        lEquipment Recommendations  Defer to next venue    Recommendations for Other Services     Frequency Min 3X/week    Precautions / Restrictions Precautions Precautions: Fall   Pertinent Vitals/Pain No pain      Mobility  Bed Mobility Bed Mobility: Supine to Sit Supine to Sit: 6: Modified independent (Device/Increase time) Transfers Transfers: Sit to Stand;Stand to Sit Sit to Stand: 4: Min assist;With upper extremity assist;From bed Stand to Sit: 4: Min assist;With upper extremity assist;To bed Details for Transfer Assistance: verbal cues for safety with lines/leads, assist for unsteadiness and shakiness with extremities Ambulation/Gait Ambulation/Gait Assistance: 3: Mod assist Ambulation Distance (Feet): 4 Feet (x2) Assistive device: Rolling walker Ambulation/Gait Assistance Details: +2 for safety, pt ambulated with RW and assist for balance to BSC to urinate then walked backwards to bed, pt refused any more ambulation Gait Pattern: Step-through pattern;Decreased stride length    Exercises     PT Diagnosis: Difficulty walking  PT Problem List: Decreased activity tolerance;Decreased mobility;Decreased balance;Decreased safety awareness;Decreased  knowledge of use of DME PT Treatment Interventions: DME instruction;Gait training;Functional mobility training;Therapeutic activities;Therapeutic exercise;Balance training;Patient/family education;Neuromuscular re-education   PT Goals Acute Rehab PT Goals PT Goal Formulation: With patient Time For Goal Achievement: 01/22/12 Potential to Achieve Goals: Good Pt will go Sit to Stand: with modified independence PT Goal: Sit to Stand - Progress: Goal set today Pt will go Stand to Sit: with modified independence PT Goal: Stand to Sit - Progress: Goal set today Pt will Ambulate: >150 feet;with modified independence;with least restrictive assistive device  Visit Information  Last PT Received On: 01/15/12 Assistance Needed: +2    Subjective Data  Subjective: "I need to pee."   Prior Functioning  Home Living Home Adaptive Equipment: Walker - rolling Additional Comments: Pt reports he doesn't need any help at home and has RW.  Per chart, pt lives with roommate.  Pt not willing to answer questions. Prior Function Level of Independence: Independent Communication Communication: No difficulties    Cognition  Overall Cognitive Status: Impaired Area of Impairment: Safety/judgement;Awareness of deficits Safety/Judgement: Decreased awareness of safety precautions;Decreased awareness of need for assistance;Impulsive    Extremity/Trunk Assessment Right Lower Extremity Assessment RLE ROM/Strength/Tone: WFL for tasks assessed Left Lower Extremity Assessment LLE ROM/Strength/Tone: WFL for tasks assessed   Balance    End of Session PT - End of Session Activity Tolerance: Patient tolerated treatment well Patient left: in bed;with call bell/phone within reach;Other (comment) (with sitter)   Brittania Sudbeck,KATHrine E 01/15/2012, 12:20 PM Pager: [phone]

## 2012-01-15 NOTE — Progress Notes (Signed)
Utilization review completed.  

## 2012-01-15 NOTE — Progress Notes (Signed)
[facility] and ED Cm reviewed weekend self pay admission and noted this pt. Pt is not active with [facility] (has not been for a few years) [facility] is not accepting new patients at this time per [facility]ette at [facility] [phone]

## 2012-01-15 NOTE — H&P (Signed)
[facility] History and Physical  Edgar Vazquez NWG:[id number] DOB: Nov 15, 1945 DOA: 01/14/2012  Referring physician: ED PCP: Lehman Prom, NP   Chief Complaint: ingestion of lisinopril  HPI:  66 year old male with no significant past medical history who presented to ED with ingestion of approximately 60 pills of lisinopril. Patient called EMS as he felt what he described as interpersonal differences. Patient denied chest pain, shortness of breath, lightheadedness or loss of consciousness. No complaints of abdominal pain, nausea or vomiting. Patient denies suicidal or homicidal ideations.   Review of Systems:  Constitutional: Negative for fever, chills and malaise/fatigue. Negative for diaphoresis.  HENT: Negative for hearing loss, ear pain, nosebleeds, congestion, sore throat, neck pain, tinnitus and ear discharge.   Eyes: Negative for blurred vision, double vision, photophobia, pain, discharge and redness.  Respiratory: Negative for cough, hemoptysis, sputum production, shortness of breath, wheezing and stridor.   Cardiovascular: Negative for chest pain, palpitations, orthopnea, claudication and leg swelling.  Gastrointestinal: Negative for nausea, vomiting and abdominal pain. Negative for heartburn, constipation, blood in stool and melena.  Genitourinary: Negative for dysuria, urgency, frequency, hematuria and flank pain.  Musculoskeletal: Negative for myalgias, back pain, joint pain and falls.  Skin: Negative for itching and rash.  Neurological: Negative for dizziness and weakness. Negative for tingling, tremors, sensory change, speech change, focal weakness, loss of consciousness and headaches.  Endo/Heme/Allergies: Negative for environmental allergies and polydipsia. Does not bruise/bleed easily.  Psychiatric/Behavioral: per HPI     History reviewed. No pertinent past medical history. Past Surgical History  Procedure Date  . Ankle surgery    Social History:  reports  that he has been smoking.  He does not have any smokeless tobacco history on file. He reports that he drinks alcohol. He reports that he does not use illicit drugs.  Allergies  Allergen Reactions  . Penicillins     No family history on file.  Prior to Admission medications   Not on File   Physical Exam: Filed Vitals:   01/14/12 2330 01/14/12 2345 01/15/12 0000 01/15/12 0030  BP: 93/61 102/60 111/72 99/76  Pulse: 64 61 64 59  Temp:      TempSrc:      Resp: 16 15 15 15   Height:      Weight:      SpO2: 98% 99% 99% 99%  BP 99/76  Pulse 59  Temp 98 F (36.7 C) (Oral)  Resp 15  Ht 5\' 11"  (1.803 m)  Wt 160 lb (72.576 kg)  BMI 22.32 kg/m2  SpO2 99%  General Appearance:    Alert, cooperative, no distress, appears stated age  Head:    Normocephalic, without obvious abnormality, atraumatic  Eyes:    PERRL, conjunctiva/corneas clear, EOM's intact, fundi    benign, both eyes       Ears:    Normal TM's and external ear canals, both ears  Nose:   Nares normal, septum midline, mucosa normal, no drainage   or sinus tenderness  Throat:   Lips, mucosa, and tongue normal; teeth and gums normal  Neck:   Supple, symmetrical, trachea midline, no adenopathy;       thyroid:  No enlargement/tenderness/nodules; no carotid   bruit or JVD  Back:     Symmetric, no curvature, ROM normal, no CVA tenderness  Lungs:     Clear to auscultation bilaterally, respirations unlabored  Chest wall:    No tenderness or deformity  Heart:    Regular rate and rhythm, S1 and S2  normal, no murmur, rub   or gallop  Abdomen:     Soft, non-tender, bowel sounds active all four quadrants,    no masses, no organomegaly  Extremities:   Extremities normal, atraumatic, no cyanosis or edema  Pulses:   2+ and symmetric all extremities  Skin:   Skin color, texture, turgor normal, no rashes or lesions  Lymph nodes:   Cervical, supraclavicular, and axillary nodes normal  Neurologic:   CNII-XII intact. Normal strength,  sensation and reflexes      throughout     Labs on Admission:  Basic Metabolic Panel:  Lab 01/14/12 7829  NA 131*  K 2.6*  CL 90*  CO2 29  GLUCOSE 100*  BUN <3*  CREATININE 0.77  CALCIUM 9.4  MG --  PHOS --   Liver Function Tests:  Lab 01/14/12 2225  AST 15  ALT 12  ALKPHOS 95  BILITOT 0.5  PROT 7.4  ALBUMIN 3.7   CBC:  Lab 01/14/12 2225  WBC 6.4  NEUTROABS --  HGB 14.3  HCT 42.2  MCV 91.7  PLT 244    Radiological Exams on Admission: No results found.  EKG: Independently reviewed. Non acute  Assessment/Plan  Principal Problem: *Lisinopril ingestion - it is unclear what exactly provoked patient to take lisinopril whether it was just personality disorder or intentionally trying to hurt himself - patient will need psych evaluation in am - per poison control half life of lisinopril is about 8 hours - alcohol level is within normal limits on this admission - UDS is negative - for now patient will be admitted to SDU for observation  - replete electrolytes, please note that the potassium has been repleted in ED  Diet - regular  Code Status: full code Family Communication: none at bedside Disposition Plan: likely home when stable  Manson Passey, MD  [facility] Pager [phone]  Time spent: 45 minutes  If 7PM-7AM, please contact night-coverage [email or web address] Password [facility] 01/15/2012, 12:48 AM

## 2012-01-16 ENCOUNTER — Inpatient Hospital Stay (HOSPITAL_COMMUNITY): Payer: Medicaid Other

## 2012-01-16 DIAGNOSIS — F1910 Other psychoactive substance abuse, uncomplicated: Secondary | ICD-10-CM

## 2012-01-16 DIAGNOSIS — F0390 Unspecified dementia without behavioral disturbance: Secondary | ICD-10-CM

## 2012-01-16 DIAGNOSIS — F430 Acute stress reaction: Secondary | ICD-10-CM

## 2012-01-16 LAB — DIFFERENTIAL
Basophils Absolute: 0 10*3/uL (ref 0.0–0.1)
Eosinophils Absolute: 0 10*3/uL (ref 0.0–0.7)
Eosinophils Relative: 0 % (ref 0–5)
Lymphs Abs: 1.1 10*3/uL (ref 0.7–4.0)
Neutrophils Relative %: 76 % (ref 43–77)

## 2012-01-16 LAB — CBC
MCH: 30.5 pg (ref 26.0–34.0)
MCV: 89.7 fL (ref 78.0–100.0)
Platelets: 225 10*3/uL (ref 150–400)
RBC: 4.19 MIL/uL — ABNORMAL LOW (ref 4.22–5.81)
RDW: 13.5 % (ref 11.5–15.5)
WBC: 8.3 10*3/uL (ref 4.0–10.5)

## 2012-01-16 LAB — BASIC METABOLIC PANEL
BUN: <3 mg/dL — ABNORMAL LOW (ref 6–23)
Chloride: 94 mEq/L — ABNORMAL LOW (ref 96–112)
Creatinine, Ser: 0.6 mg/dL (ref 0.50–1.35)
Glucose, Bld: 110 mg/dL — ABNORMAL HIGH (ref 70–99)
Potassium: 4.4 mEq/L (ref 3.5–5.1)

## 2012-01-16 MED ORDER — NICOTINE 14 MG/24HR TD PT24
14.0000 mg | MEDICATED_PATCH | Freq: Every day | TRANSDERMAL | Status: DC
  Administered 2012-01-17 – 2012-01-23 (×7): 14 mg via TRANSDERMAL
  Filled 2012-01-16 (×8): qty 1

## 2012-01-16 MED ORDER — HYDRALAZINE HCL 10 MG PO TABS
10.0000 mg | ORAL_TABLET | Freq: Three times a day (TID) | ORAL | Status: DC
  Administered 2012-01-17 – 2012-01-18 (×2): 10 mg via ORAL
  Filled 2012-01-16 (×9): qty 1

## 2012-01-16 MED ORDER — HYDRALAZINE HCL 20 MG/ML IJ SOLN
5.0000 mg | Freq: Once | INTRAMUSCULAR | Status: AC
  Administered 2012-01-16: 5 mg via INTRAVENOUS
  Filled 2012-01-16: qty 1

## 2012-01-16 MED ORDER — LORAZEPAM 2 MG/ML IJ SOLN
1.0000 mg | Freq: Once | INTRAMUSCULAR | Status: AC
  Administered 2012-01-16: 1 mg via INTRAVENOUS

## 2012-01-16 MED ORDER — MEMANTINE HCL 10 MG PO TABS
10.0000 mg | ORAL_TABLET | Freq: Two times a day (BID) | ORAL | Status: DC
  Administered 2012-01-17 – 2012-01-23 (×13): 10 mg via ORAL
  Filled 2012-01-16 (×17): qty 1

## 2012-01-16 MED ORDER — LORAZEPAM 2 MG/ML IJ SOLN
1.0000 mg | Freq: Once | INTRAMUSCULAR | Status: AC
  Administered 2012-01-16: 1 mg via INTRAVENOUS
  Filled 2012-01-16: qty 1

## 2012-01-16 MED ORDER — LORAZEPAM 2 MG/ML IJ SOLN
INTRAMUSCULAR | Status: AC
  Administered 2012-01-16: 1 mg via INTRAVENOUS
  Filled 2012-01-16: qty 1

## 2012-01-16 MED ORDER — HALOPERIDOL LACTATE 5 MG/ML IJ SOLN: 2.0000 mg | Freq: Once | INTRAMUSCULAR | Status: DC | PRN

## 2012-01-16 MED ORDER — ACETAMINOPHEN 650 MG RE SUPP
650.0000 mg | Freq: Four times a day (QID) | RECTAL | Status: DC | PRN
Start: 2012-01-16 — End: 2012-01-23
  Administered 2012-01-16: 650 mg via RECTAL
  Filled 2012-01-16: qty 1

## 2012-01-16 NOTE — Progress Notes (Signed)
Subjective: Patient has been quite uncooperative today and refusing tests and mediations. discussed again with psych consult after hearing the story of his behavior at home Dr Ferol Luz thinks he lacks capacity to make decisions. He doesnot have a place to live ow as Ms Larinda Buttery whom he lives with doesn't want him back  Objective:  Vital signs in last 24 hours:  Filed Vitals:   01/15/12 2330 01/16/12 0024 01/16/12 0506 01/16/12 1512  BP: 163/97 189/102 146/76 135/77  Pulse: 76  103 98  Temp: 98.4 F (36.9 C)  98.1 F (36.7 C) 98.4 F (36.9 C)  TempSrc: Oral  Oral Oral  Resp: 18  20 18   Height:      Weight:      SpO2: 97%  96% 98%    Intake/Output from previous day:   Intake/Output Summary (Last 24 hours) at 01/16/12 1700 Last data filed at 01/16/12 1403  Gross per 24 hour  Intake   4425 ml  Output   6475 ml  Net  -2050 ml    Physical Exam:  General: elderly male with in no acute distress. Uncooperative with exam.   Neuro: Alert, awake, oriented  , flat affect  Did not allow for physical exam  Lab Results:  Basic Metabolic Panel:    Component Value Date/Time   NA 127* 01/16/2012 0925   K 4.4 01/16/2012 0925   CL 94* 01/16/2012 0925   CO2 22 01/16/2012 0925   BUN <3* 01/16/2012 0925   CREATININE 0.60 01/16/2012 0925   GLUCOSE 110* 01/16/2012 0925   CALCIUM 9.2 01/16/2012 0925   CBC:    Component Value Date/Time   WBC 5.7 01/15/2012 0345   HGB 13.0 01/15/2012 0345   HCT 40.0 01/15/2012 0345   PLT 234 01/15/2012 0345   MCV 93.2 01/15/2012 0345   NEUTROABS 3.7 01/15/2012 0345   LYMPHSABS 1.3 01/15/2012 0345   MONOABS 0.5 01/15/2012 0345   EOSABS 0.1 01/15/2012 0345   BASOSABS 0.0 01/15/2012 0345    Recent Results (from the past 240 hour(s))  MRSA PCR SCREENING     Status: Normal   Collection Time   01/15/12  2:31 AM      Component Value Range Status Comment   MRSA by PCR NEGATIVE  NEGATIVE Final     Studies/Results: No results found.  Medications: Scheduled Meds:     . cloNIDine  0.2 mg Transdermal Weekly  . enoxaparin  40 mg Subcutaneous Q24H  . hydrALAZINE  5 mg Intravenous Once  . hydrALAZINE  5 mg Intravenous Once  . hydrALAZINE  10 mg Oral Q8H  . LORazepam  1 mg Intravenous Once  . memantine  10 mg Oral BID  . nicotine  14 mg Transdermal Daily  . potassium chloride  40 mEq Oral BID  . sodium chloride  3 mL Intravenous Q12H   Continuous Infusions:   . DISCONTD: sodium chloride 125 mL/hr at 01/15/12 1800   PRN Meds:.albuterol, ondansetron (ZOFRAN) IV, ondansetron, DISCONTD: hydrALAZINE  Assessment/Plan: 66 y/o male with hx of HTN, etoh cardiomyopathy, tobacco use admitted for unknown quantity of lisinopril ingestion and now noted for having irrational behavior at home (please see social worker's note from today and hospitalist progress note from 6/24 for detail) now requiring placement due to lack of capacity .  *Lisinopril ingestion  -unclear how many pills he took. He was monitored in stepdown and stable.  -i was  informed by Ms Eyvonne Mechanic whom he lives with that he has irrational  behavior, abusing , cursing and being agitated time to time. She does not want him back in the house  - alcohol level is within normal limits  - Utox is negative  -psych consult appreciated. recommends namenda for underlying dementia. Patient agitated an uncooperative. i will order low dose ativan  prn. Avoiding haldol and seroquel due to prolonged Qtc ( 503) on presentation.  Discussed with Dr Ferol Luz again today. Given his irrational behaviors ar home she recommends that he lacks decision making capacity.  -PT eval recommends SNF  -please get psych follow up for further recommendations if patient continues to be uncooperative and agitated  Hypokalemia and hyponatremia  No clear explanation  Patient should be hyperkalemic than hypo with lisinopril toxicity  k replenished. Cont IV fluids for hyponatremia. Will check urine sodium and osm   HTN  on clonidine  patch, will continue . Seems to be non compliant to meds Added hydralazone   Hx of etoh cardiomyopathy  Denies any etoh use currently   Tobacco abuse  counseled on smoking cessation  Nicotine patch  DVT prophylaxis  sq LOVENOX  Diet: low sodium   full code  Patient lacks decision making capacity   Dispo:  To SNF when available    LOS: 2 days   Edgar Vazquez 01/16/2012, 5:00 PM
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
## 2012-01-16 NOTE — Progress Notes (Addendum)
Spoke at length with Pt's brother, Zavion Sleight ([phone])  Mr. Silverman states that Pt has been with [name] for 28 years and that they have been living together for the past 5 years.  Mr. Ginsberg states that Pt does receive a government check and he questions if [name] is still receiving this check.  Mr. Patalano would like for Pt to go to SNF through the [place].  Mr. Kaltenbach ok with CSW searching [place] SNFs, as well.  CSW thanked Mr. Rivkin for his time and assistance.  Providence Crosby, LCSWA Clinical Social Work [phone]-
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
## 2012-01-16 NOTE — Progress Notes (Signed)
Physical Therapy Treatment Patient Details Name: Edgar Vazquez MRN: [id number] DOB: 05/11/1946 Today's Date: 01/16/2012 Time: 1035-1050 PT Time Calculation (min): 15 min  PT Assessment / Plan / Recommendation Comments on Treatment Session  Pt required MAX encouragement to participate.  Easily gets aggitated and is impulsive.  Amb full unit w/o AD.  Very drunken, unsteady gait.  HIGH FALL RISK.    Follow Up Recommendations  Skilled nursing facility;Supervision/Assistance - 24 hour    Barriers to Discharge        Equipment Recommendations  Defer to next venue    Recommendations for Other Services    Frequency     Plan Discharge plan remains appropriate    Precautions / Restrictions          Mobility  Bed Mobility Bed Mobility: Supine to Sit Supine to Sit: 6: Modified independent (Device/Increase time) Details for Bed Mobility Assistance: MAX encouragement to participate Transfers Transfers: Sit to Stand;Stand to Sit Sit to Stand: 4: Min guard;From bed Stand to Sit: 4: Min guard;To chair/3-in-1 Details for Transfer Assistance: impulsive and unsteady Ambulation/Gait Ambulation/Gait Assistance: 3: Mod assist;4: Min assist Ambulation Distance (Feet): 150 Feet Assistive device: None Ambulation/Gait Assistance Details: Max encouragement to participate.  Pt refused the use of a walker.  Pt unsteady and impulsive with delayed corrective balance response and demon moderate mouth drooling during session. Pt c/o feeling "groggy".  Sitter in room. Gait Pattern: Step-through pattern;Shuffle Gait velocity: decreased     PT Goals   progressing    Visit Information  Last PT Received On: 01/16/12 Assistance Needed: +1                   End of Session PT - End of Session Equipment Utilized During Treatment: Gait belt Activity Tolerance: Patient tolerated treatment well Patient left: in chair;with call bell/phone within reach;Other (comment) (NT sitter in room)   Felecia Shelling   PTA [facility]  Acute  Rehab Pager     [phone]

## 2012-01-16 NOTE — Progress Notes (Signed)
Patient refused his am meds this am.  He has also removed his tele and refuses to let us put it back on.  Dr. Gonzella Lex notified.  Patient will not verbalize any concerns or thoughts on his mind at this time.

## 2012-01-17 DIAGNOSIS — E871 Hypo-osmolality and hyponatremia: Secondary | ICD-10-CM

## 2012-01-17 DIAGNOSIS — F0390 Unspecified dementia without behavioral disturbance: Secondary | ICD-10-CM

## 2012-01-17 DIAGNOSIS — F1910 Other psychoactive substance abuse, uncomplicated: Secondary | ICD-10-CM

## 2012-01-17 DIAGNOSIS — E876 Hypokalemia: Secondary | ICD-10-CM

## 2012-01-17 LAB — URINALYSIS, ROUTINE W REFLEX MICROSCOPIC
Glucose, UA: NEGATIVE mg/dL
Hgb urine dipstick: NEGATIVE
Ketones, ur: >80 mg/dL — AB
Specific Gravity, Urine: 1.018 (ref 1.005–1.030)
pH: 6 (ref 5.0–8.0)

## 2012-01-17 LAB — BASIC METABOLIC PANEL
BUN: 7 mg/dL (ref 6–23)
BUN: 7 mg/dL (ref 6–23)
Calcium: 9.2 mg/dL (ref 8.4–10.5)
Calcium: 9 mg/dL (ref 8.4–10.5)
Calcium: 9.2 mg/dL (ref 8.4–10.5)
Creatinine, Ser: 0.66 mg/dL (ref 0.50–1.35)
Creatinine, Ser: 0.68 mg/dL (ref 0.50–1.35)
Creatinine, Ser: 0.64 mg/dL (ref 0.50–1.35)
GFR calc Af Amer: >90 mL/min (ref >90)
GFR calc Af Amer: >90 mL/min (ref >90)
GFR calc non Af Amer: >90 mL/min (ref >90)
GFR calc non Af Amer: >90 mL/min (ref >90)
GFR calc non Af Amer: >90 mL/min (ref >90)
Glucose, Bld: 87 mg/dL (ref 70–99)
Glucose, Bld: 111 mg/dL — ABNORMAL HIGH (ref 70–99)
Potassium: 4.3 mEq/L (ref 3.5–5.1)
Sodium: 120 mEq/L — ABNORMAL LOW (ref 135–145)

## 2012-01-17 LAB — PROCALCITONIN: Procalcitonin: <0.1 ng/mL

## 2012-01-17 LAB — OSMOLALITY: Osmolality: 260 mOsm/kg — ABNORMAL LOW (ref 275–300)

## 2012-01-17 LAB — GLUCOSE, CAPILLARY: Glucose-Capillary: 93 mg/dL (ref 70–99)

## 2012-01-17 MED ORDER — FUROSEMIDE 20 MG PO TABS
20.0000 mg | ORAL_TABLET | Freq: Two times a day (BID) | ORAL | Status: DC
  Administered 2012-01-18 – 2012-01-20 (×4): 20 mg via ORAL
  Filled 2012-01-17 (×8): qty 1

## 2012-01-17 NOTE — Progress Notes (Signed)
Attempted to meet with Pt.  Pt been sleeping most of the day.  Faxed Pt's information to [employer].  CSW to continue to follow.  Providence Crosby, LCSWA Clinical Social Work [phone]

## 2012-01-17 NOTE — Progress Notes (Signed)
Psych consulted yesterday evening for IVC on Pt.  Notified MD.  Notified Magistrate of impending IVC documents fax.  Confirmed receipt of said fax.  CSW to continue to follow.  Providence Crosby, LCSWA Clinical Social Work [phone]

## 2012-01-17 NOTE — Progress Notes (Signed)
Message from Pt's brother alerting CSW to belongings that he left for Pt yesterday.  He sated that the belongings were secured for Pt by hospital staff and he hopes that Pt can receive these items upon d/c.  CSW thanked Mr. Hao for his time.  CSW to continue to follow.  Providence Crosby, LCSWA Clinical Social Work [phone]

## 2012-01-17 NOTE — Progress Notes (Signed)
Subjective: Patient has no acute complaints.  Sitter mentions that he has been sleeping today.  Received IVF's on 01/15/12 which was d/c'd on 6/25 at 833AM.  Reportedly patient currently has no place to go on D/C.  Social worker is currently looking for placement.  Patient is aware that girl friend does not want him back at her home.  Objective: Filed Vitals:   01/16/12 1512 01/16/12 2215 01/17/12 0133 01/17/12 0611  BP: 135/77 159/98  119/80  Pulse: 98 112  81  Temp: 98.4 F (36.9 C) 101.7 F (38.7 C) 96.4 F (35.8 C) 99.8 F (37.7 C)  TempSrc: Oral Axillary Axillary Axillary  Resp: 18 22  22   Height:      Weight:      SpO2: 98% 91%  96%   Weight change:  No intake or output data in the 24 hours ending 01/17/12 1409  General: Alert, awake,  in no acute distress.  HEENT: No bruits, no goiter.  Heart: Regular rate and rhythm, without murmurs, rubs, gallops.  Lungs: Clear to auscultation, bilateral air movement.  Abdomen: Soft, nontender, nondistended, positive bowel sounds.  Neuro: Patient responds to questions appropriately, moves all extremities.   Lab Results:  Basename 01/16/12 2313 01/16/12 0925 01/15/12 0345  NA 120* 127* --  K 4.1 4.4 --  CL 88* 94* --  CO2 22 22 --  GLUCOSE 87 110* --  BUN 4* <3* --  CREATININE 0.66 0.60 --  CALCIUM 9.2 9.2 --  MG -- -- 1.6  PHOS -- -- 3.0    Basename 01/15/12 0345 01/14/12 2225  AST 13 15  ALT 10 12  ALKPHOS 81 95  BILITOT 0.3 0.5  PROT 6.1 7.4  ALBUMIN 3.1* 3.7   No results found for this basename: LIPASE:2,AMYLASE:2 in the last 72 hours  Basename 01/16/12 2313 01/15/12 0345  WBC 8.3 5.7  NEUTROABS 6.3 3.7  HGB 12.8* 13.0  HCT 37.6* 40.0  MCV 89.7 93.2  PLT 225 234    Basename 01/15/12 1840 01/15/12 1040 01/15/12 0345  CKTOTAL 146 136 130  CKMB 5.9* 5.5* 5.0*  CKMBINDEX -- -- --  TROPONINI <0.30 <0.30 <0.30   No components found with this basename: POCBNP:3 No results found for this basename: DDIMER:2 in  the last 72 hours  Basename 01/15/12 0345  HGBA1C 5.7*   No results found for this basename: CHOL:2,HDL:2,LDLCALC:2,TRIG:2,CHOLHDL:2,LDLDIRECT:2 in the last 72 hours  Basename 01/15/12 0345  TSH 1.539  T4TOTAL --  T3FREE --  THYROIDAB --   No results found for this basename: VITAMINB12:2,FOLATE:2,FERRITIN:2,TIBC:2,IRON:2,RETICCTPCT:2 in the last 72 hours  Micro Results: Recent Results (from the past 240 hour(s))  MRSA PCR SCREENING     Status: Normal   Collection Time   01/15/12  2:31 AM      Component Value Range Status Comment   MRSA by PCR NEGATIVE  NEGATIVE Final     Studies/Results: Dg Chest Port 1 View  01/16/2012  *RADIOLOGY REPORT*  Clinical Data: 66 year old male with fever.  PORTABLE CHEST - 1 VIEW  Comparison: 04/12/2007 and earlier.  Findings: Portable AP view 2240 hours.  Confluent retrocardiac opacity.  Stable cardiac size and mediastinal contours.  No pneumothorax, pulmonary edema or definite effusion.  IMPRESSION: Left lower lobe pneumonia suspected.  PA and lateral chest radiographs would be valuable when possible.  Original Report Authenticated By: Harley Hallmark, M.D.    Medications: I have reviewed the patient's current medications.   Patient Active Hospital Problem List: Polysubstance abuse (01/16/2012) -  Psych on board will f/u with their recommendation and recommend cessation.  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 - fluid restrict patient to 1000 ml/day - lasix 20 mg bid -(if not improved with fluid restriction and lasix will plan on adding salt tablets) - Check BMP and avoid rapid correction of >8 meq of Na in a 24 hour period.  HYPERTENSION, BENIGN ESSENTIAL (11/19/2007) At this point well controlled last BP 119/80.  Will plan on continuing current regimen     LOS: 3 days   Penny Pia M.D.  [facility] 01/17/2012, 2:09  PM

## 2012-01-17 NOTE — Progress Notes (Signed)
Spoke with French Ana at [facility].  Discussed Pt's insurance with French Ana.    French Ana suggested that CSW contact Lurena Joiner at the [facility] [phone] ext [phone]) to discuss with her if Pt is service connected for contract at local SNF.  LM for [facility] at [place].  Providence Crosby, LCSWA Clinical Social Work [phone]

## 2012-01-17 NOTE — Progress Notes (Signed)
LM for Andi Hence, [facility] rep, at [phone] ext [phone].  CSW to continue to follow.  Providence Crosby, LCSWA Clinical Social Work [phone]

## 2012-01-17 NOTE — Progress Notes (Signed)
Spoke with Joey at the [place] in [place] [phone] ext [phone]).  Per Joey, Pt hasn't been seen by the [place] since 2005, thus he cannot receive long-tern SNF.  He transferred CSW to long-term SNF specialist, Levander Campion, ext [phone].  Per Mr. Mayford Knife, Pt is eligible for short-term SNF, provided that he has a d/c plan.  CSW explained to Mr. Turner that Pt's d/c plan is unknown and that Pt likely needs long-term SNF.  Mr. Mayford Knife faxed CSW the short-term SNF packet, should it be determined that short-term SNF is appropriate.  CSW to continue to follow.  Providence Crosby, LCSWA Clinical Social Work [phone]

## 2012-01-18 DIAGNOSIS — F1910 Other psychoactive substance abuse, uncomplicated: Secondary | ICD-10-CM

## 2012-01-18 DIAGNOSIS — E876 Hypokalemia: Secondary | ICD-10-CM

## 2012-01-18 DIAGNOSIS — E871 Hypo-osmolality and hyponatremia: Secondary | ICD-10-CM

## 2012-01-18 DIAGNOSIS — F0390 Unspecified dementia without behavioral disturbance: Secondary | ICD-10-CM

## 2012-01-18 LAB — BASIC METABOLIC PANEL
BUN: 6 mg/dL (ref 6–23)
CO2: 24 mEq/L (ref 19–32)
Calcium: 8.8 mg/dL (ref 8.4–10.5)
Calcium: 8.7 mg/dL (ref 8.4–10.5)
Calcium: 9.2 mg/dL (ref 8.4–10.5)
Calcium: 9 mg/dL (ref 8.4–10.5)
Chloride: 86 mEq/L — ABNORMAL LOW (ref 96–112)
Creatinine, Ser: 0.66 mg/dL (ref 0.50–1.35)
Creatinine, Ser: 0.59 mg/dL (ref 0.50–1.35)
GFR calc Af Amer: >90 mL/min (ref >90)
GFR calc Af Amer: >90 mL/min (ref >90)
GFR calc Af Amer: >90 mL/min (ref >90)
GFR calc Af Amer: >90 mL/min (ref >90)
GFR calc Af Amer: >90 mL/min (ref >90)
GFR calc non Af Amer: >90 mL/min (ref >90)
GFR calc non Af Amer: >90 mL/min (ref >90)
GFR calc non Af Amer: >90 mL/min (ref >90)
GFR calc non Af Amer: >90 mL/min (ref >90)
GFR calc non Af Amer: >90 mL/min (ref >90)
Glucose, Bld: 98 mg/dL (ref 70–99)
Potassium: 4.1 mEq/L (ref 3.5–5.1)
Potassium: 4.5 mEq/L (ref 3.5–5.1)
Potassium: 4.2 mEq/L (ref 3.5–5.1)
Potassium: 4.2 mEq/L (ref 3.5–5.1)
Sodium: 120 mEq/L — ABNORMAL LOW (ref 135–145)
Sodium: 120 mEq/L — ABNORMAL LOW (ref 135–145)
Sodium: 118 mEq/L — CL (ref 135–145)
Sodium: 119 mEq/L — CL (ref 135–145)
Sodium: 121 mEq/L — ABNORMAL LOW (ref 135–145)

## 2012-01-18 LAB — URINE CULTURE
Colony Count: 30000
Culture  Setup Time: 201306261737
Special Requests: NORMAL

## 2012-01-18 LAB — GLUCOSE, CAPILLARY: Glucose-Capillary: 97 mg/dL (ref 70–99)

## 2012-01-18 MED ORDER — SODIUM CHLORIDE 1 G PO TABS
1.0000 g | ORAL_TABLET | Freq: Three times a day (TID) | ORAL | Status: DC
  Administered 2012-01-18: 1 g via ORAL
  Filled 2012-01-18 (×5): qty 1

## 2012-01-18 MED ORDER — PSYLLIUM 95 % PO PACK
1.0000 | PACK | Freq: Every day | ORAL | Status: DC
  Administered 2012-01-18 – 2012-01-23 (×6): 1 via ORAL
  Filled 2012-01-18 (×6): qty 1

## 2012-01-18 MED ORDER — SODIUM CHLORIDE 1 G PO TABS
2.0000 g | ORAL_TABLET | Freq: Once | ORAL | Status: AC
  Administered 2012-01-18: 2 g via ORAL
  Filled 2012-01-18: qty 2

## 2012-01-18 MED ORDER — SODIUM CHLORIDE 1 G PO TABS
1.0000 g | ORAL_TABLET | Freq: Three times a day (TID) | ORAL | Status: DC
  Filled 2012-01-18 (×2): qty 1

## 2012-01-18 NOTE — Progress Notes (Signed)
Physical Therapy Treatment Patient Details Name: PATRICIO POPWELL MRN: [id number] DOB: [birth year] Today's Date: 01/18/2012 Time: 1410-1420 PT Time Calculation (min): 10 min  PT Assessment / Plan / Recommendation Comments on Treatment Session  Pt in bed, lights off, TV off and blinds shut.  Sitter in room reports pt is weak and just used the [facility].  Pt requires max encouragement to participate as always, however appears eben more disengaged and uninterested.  Poor eye conatct and one word answers.  Did amb pt in the hallway but used a RW this time due to increased unsteadyness.  Pt has a difficult D/C dispostion and plans to D/C to SNF.    Follow Up Recommendations  Skilled nursing facility;Supervision/Assistance - 24 hour    Barriers to Discharge        Equipment Recommendations  Defer to next venue    Recommendations for Other Services    Frequency     Plan Discharge plan remains appropriate    Precautions / Restrictions     Pertinent Vitals/Pain No c/o pain Continues to drool C/o denture not fitting and asking for fixadent    Mobility  Bed Mobility Bed Mobility: Supine to Sit;Sit to Supine Supine to Sit: 6: Modified independent (Device/Increase time) Sit to Supine: 6: Modified independent (Device/Increase time) Details for Bed Mobility Assistance: MAX encouragement to participate  Transfers Transfers: Sit to Stand;Stand to Sit Sit to Stand: 4: Min guard;From bed Stand to Sit: 4: Min guard;To bed Details for Transfer Assistance: impulsive and unsteady  Ambulation/Gait Ambulation/Gait Assistance: 3: Mod assist Ambulation Distance (Feet): 150 Feet Assistive device: Rolling walker Ambulation/Gait Assistance Details: Used a RW this time due to increased unsteadyness and decreased alertness.  Appears more disengaged and quiet.  One word answers and poor eye contact. Appears uninterested. Gait Pattern: Step-through pattern;Shuffle (poor flex posture, drunken gait) Gait velocity:  decreased        Visit Information  Last PT Received On: 01/18/12 Assistance Needed: +1                   End of Session PT - End of Session Equipment Utilized During Treatment: Gait belt Activity Tolerance: Patient limited by fatigue Patient left: in bed;with call bell/phone within reach;Other (comment) (NT sitter in room)   Felecia Shelling  PTA [facility]  Acute  Rehab Pager     [phone]

## 2012-01-18 NOTE — Progress Notes (Signed)
Left 2nd message for Lurena Joiner at the [place].    Providence Crosby, LCSWA Clinical Social Work [phone]

## 2012-01-18 NOTE — Progress Notes (Signed)
Pt MA level 118

## 2012-01-18 NOTE — Progress Notes (Signed)
Subjective:  Patient has no complaints today.  Nursing reports that he seems more confused today.  He mentions that he has been having diarrhea and having 2 bouts per day.  But nursing reports that he has not been having diarrhea.  Denies any abdominal pain, fever, or chills.  Objective: Filed Vitals:   01/17/12 1500 01/17/12 2123 01/18/12 0551 01/18/12 1434  BP: 115/75 108/79 127/73 134/85  Pulse: 87 79 67 73  Temp: 98.9 F (37.2 C) 98.6 F (37 C) 98.4 F (36.9 C) 98.3 F (36.8 C)  TempSrc: Axillary Oral Oral Oral  Resp: 24 20 19 20   Height:      Weight: 75.932 kg (167 lb 6.4 oz)  76.431 kg (168 lb 8 oz)   SpO2: 97% 98% 95% 96%   Weight change:   Intake/Output Summary (Last 24 hours) at 01/18/12 1640 Last data filed at 01/18/12 0753  Gross per 24 hour  Intake    240 ml  Output    955 ml  Net   -715 ml   General: Alert, awake, in no acute distress.  HEENT: No bruits, no goiter.  Heart: Regular rate and rhythm, without murmurs, rubs, gallops.  Lungs: Clear to auscultation, bilateral air movement.  Abdomen: Soft, nontender, nondistended, positive bowel sounds.  Neuro: Patient responds to questions appropriately, moves all extremities.   Lab Results:  Basename 01/18/12 1430 01/18/12 1122  NA 118* 119*  K 4.4 4.5  CL 86* 86*  CO2 22 23  GLUCOSE 102* 99  BUN 5* 6  CREATININE 0.59 0.64  CALCIUM 9.2 9.1  MG -- --  PHOS -- --   No results found for this basename: AST:2,ALT:2,ALKPHOS:2,BILITOT:2,PROT:2,ALBUMIN:2 in the last 72 hours No results found for this basename: LIPASE:2,AMYLASE:2 in the last 72 hours  Basename 01/16/12 2313  WBC 8.3  NEUTROABS 6.3  HGB 12.8*  HCT 37.6*  MCV 89.7  PLT 225    Basename 01/15/12 1840  CKTOTAL 146  CKMB 5.9*  CKMBINDEX --  TROPONINI <0.30   No components found with this basename: POCBNP:3 No results found for this basename: DDIMER:2 in the last 72 hours No results found for this basename: HGBA1C:2 in the last 72  hours No results found for this basename: CHOL:2,HDL:2,LDLCALC:2,TRIG:2,CHOLHDL:2,LDLDIRECT:2 in the last 72 hours No results found for this basename: TSH,T4TOTAL,FREET3,T3FREE,THYROIDAB in the last 72 hours No results found for this basename: VITAMINB12:2,FOLATE:2,FERRITIN:2,TIBC:2,IRON:2,RETICCTPCT:2 in the last 72 hours  Micro Results: Recent Results (from the past 240 hour(s))  MRSA PCR SCREENING     Status: Normal   Collection Time   01/15/12  2:31 AM      Component Value Range Status Comment   MRSA by PCR NEGATIVE  NEGATIVE Final   CULTURE, BLOOD (ROUTINE X 2)     Status: Normal (Preliminary result)   Collection Time   01/16/12 11:04 PM      Component Value Range Status Comment   Specimen Description BLOOD RIGHT ANTECUBITAL   Final    Special Requests BOTTLES DRAWN AEROBIC AND ANAEROBIC 10CC   Final    Culture  Setup Time [id number]   Final    Culture     Final    Value:        BLOOD CULTURE RECEIVED NO GROWTH TO DATE CULTURE WILL BE HELD FOR 5 DAYS BEFORE ISSUING A FINAL NEGATIVE REPORT   Report Status PENDING   Incomplete   CULTURE, BLOOD (ROUTINE X 2)     Status: Normal (Preliminary result)   Collection  Time   01/16/12 11:13 PM      Component Value Range Status Comment   Specimen Description BLOOD LEFT HAND   Final    Special Requests BOTTLES DRAWN AEROBIC AND ANAEROBIC 4CC   Final    Culture  Setup Time [id number]   Final    Culture     Final    Value:        BLOOD CULTURE RECEIVED NO GROWTH TO DATE CULTURE WILL BE HELD FOR 5 DAYS BEFORE ISSUING A FINAL NEGATIVE REPORT   Report Status PENDING   Incomplete   URINE CULTURE     Status: Normal   Collection Time   01/17/12  1:35 PM      Component Value Range Status Comment   Specimen Description URINE, CLEAN CATCH   Final    Special Requests Normal   Final    Culture  Setup Time [id number]   Final    Colony Count 30,000 COLONIES/ML   Final    Culture     Final    Value: Multiple bacterial morphotypes present, none  predominant. Suggest appropriate recollection if clinically indicated.   Report Status 01/18/2012 FINAL   Final     Studies/Results: Dg Chest Port 1 View  01/16/2012  *RADIOLOGY REPORT*  Clinical Data: 66 year old male with fever.  PORTABLE CHEST - 1 VIEW  Comparison: 04/12/2007 and earlier.  Findings: Portable AP view 2240 hours.  Confluent retrocardiac opacity.  Stable cardiac size and mediastinal contours.  No pneumothorax, pulmonary edema or definite effusion.  IMPRESSION: Left lower lobe pneumonia suspected.  PA and lateral chest radiographs would be valuable when possible.  Original Report Authenticated By: Harley Hallmark, M.D.    Medications: I have reviewed the patient's current medications.  Patient Active Hospital Problem List: Polysubstance abuse (01/16/2012)  -Psych on board will f/u with their recommendation and recommend cessation.  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  - fluid restrict patient to 800 ml/day  - lasix 20 mg bid  - Given no improvement will add salt tablets.  Should patient continue to get worse or not show any improvement would plan on consulting nephrology.  - Check BMP and avoid rapid correction of >8 meq of Na in a 24 hour period.   HYPERTENSION, BENIGN ESSENTIAL (11/19/2007) At this point well controlled last BP 119/80. Will plan on continuing current regimen     LOS: 4 days   Penny Pia M.D.  [facility] 01/18/2012, 4:40 PM

## 2012-01-18 NOTE — Progress Notes (Signed)
Progress note for pt with IVC Pt has sitter.  He is disoriented.  He has asked the sitter repeatedly where he is.  He tell MD he is in [facility].  He appears to be sleeping but is aroused easily, speaks briefly and declines to answer any more questions.  He is with a sitter because he is generally disoriented and has dementia.  He is at risk of eloping without supervised care.  IVC is in place and is recommended.  Edgar Vazquez J. Ferol Luz, MD Psychiatrist  01/18/2012 6:25 PM

## 2012-01-18 NOTE — Progress Notes (Addendum)
Discussed case with Social Work Dept Asst Director, Timothy Lasso, who stated that Social Work Dept will do a LOG for Pt.  In light of this, CSW re-faxed Pt's information to [facility].  CSW also faxed Pt's information to [employer], as Pt's brother indicated that he had a relative who received SNF services at a facility in [place].  Providence Crosby, [place] Clinical Social Work [phone]

## 2012-01-18 NOTE — Progress Notes (Signed)
Per RN, Pt not medically stable, as his sodium is low and he's currently on fluid restrictions.  Attempted to meet with Pt.    Pt wanting underwear.  Notified Pt that his brother brought him some and stated that CSW will try to locate the underwear for Pt.  Per NT, Pt has been asking his current location.  Notified RN that Pt's brother brought Pt some clothing, shoes and underwear.  RN to locate these items and provide Pt with underwear.  CSW to continue to follow.  Providence Crosby, LCSWA Clinical Social Work [phone]

## 2012-01-18 NOTE — Progress Notes (Signed)
Spoke with Pt's brother re: SNF search.    Confirmed with Pt's brother that he is unable to accept Pt into his home, at this time.    Relayed information from the [place] re: Pt's options.  Brother OK with CSW expanding SNF search to neighboring counties.  Brother OK with completing a [employer] application on Pt's behalf.  Mr. Donahoe has questions re: this process.  CSW to forward Mr. Weinert questions on to financial counselor, Jasmine December.  CSW thanked Mr. Helzer for his time.  LM for financial counselor, Jasmine December, asking her to contact Pt's brother.  CSW to continue to follow.  Providence Crosby, LCSWA Clinical Social Work [phone]

## 2012-01-19 ENCOUNTER — Inpatient Hospital Stay (HOSPITAL_COMMUNITY): Payer: Medicaid Other

## 2012-01-19 DIAGNOSIS — F1910 Other psychoactive substance abuse, uncomplicated: Secondary | ICD-10-CM

## 2012-01-19 DIAGNOSIS — F0390 Unspecified dementia without behavioral disturbance: Secondary | ICD-10-CM

## 2012-01-19 DIAGNOSIS — E871 Hypo-osmolality and hyponatremia: Secondary | ICD-10-CM

## 2012-01-19 DIAGNOSIS — E876 Hypokalemia: Secondary | ICD-10-CM

## 2012-01-19 LAB — RENAL FUNCTION PANEL
Albumin: 3.4 g/dL — ABNORMAL LOW (ref 3.5–5.2)
CO2: 22 mEq/L (ref 19–32)
CO2: 23 mEq/L (ref 19–32)
Calcium: 9.3 mg/dL (ref 8.4–10.5)
Calcium: 9.1 mg/dL (ref 8.4–10.5)
Chloride: 85 mEq/L — ABNORMAL LOW (ref 96–112)
Creatinine, Ser: 0.65 mg/dL (ref 0.50–1.35)
GFR calc Af Amer: >90 mL/min (ref >90)
GFR calc Af Amer: >90 mL/min (ref >90)
GFR calc non Af Amer: >90 mL/min (ref >90)
GFR calc non Af Amer: >90 mL/min (ref >90)
Glucose, Bld: 82 mg/dL (ref 70–99)
Phosphorus: 2.7 mg/dL (ref 2.3–4.6)
Potassium: 4.5 mEq/L (ref 3.5–5.1)
Sodium: 119 mEq/L — CL (ref 135–145)
Sodium: 121 mEq/L — ABNORMAL LOW (ref 135–145)

## 2012-01-19 LAB — BASIC METABOLIC PANEL
BUN: 4 mg/dL — ABNORMAL LOW (ref 6–23)
CO2: 22 mEq/L (ref 19–32)
CO2: 20 mEq/L (ref 19–32)
Calcium: 9 mg/dL (ref 8.4–10.5)
Calcium: 9.4 mg/dL (ref 8.4–10.5)
Chloride: 84 mEq/L — ABNORMAL LOW (ref 96–112)
Chloride: 86 mEq/L — ABNORMAL LOW (ref 96–112)
GFR calc Af Amer: >90 mL/min (ref >90)
GFR calc Af Amer: >90 mL/min (ref >90)
GFR calc Af Amer: >90 mL/min (ref >90)
GFR calc Af Amer: >90 mL/min (ref >90)
GFR calc non Af Amer: >90 mL/min (ref >90)
GFR calc non Af Amer: >90 mL/min (ref >90)
GFR calc non Af Amer: >90 mL/min (ref >90)
Glucose, Bld: 78 mg/dL (ref 70–99)
Potassium: 4.7 mEq/L (ref 3.5–5.1)
Potassium: 5.2 mEq/L — ABNORMAL HIGH (ref 3.5–5.1)
Potassium: 4.4 mEq/L (ref 3.5–5.1)
Sodium: 120 mEq/L — ABNORMAL LOW (ref 135–145)
Sodium: 120 mEq/L — ABNORMAL LOW (ref 135–145)
Sodium: 120 mEq/L — ABNORMAL LOW (ref 135–145)
Sodium: 120 mEq/L — ABNORMAL LOW (ref 135–145)

## 2012-01-19 LAB — GLUCOSE, CAPILLARY
Glucose-Capillary: 53 mg/dL — ABNORMAL LOW (ref 70–99)
Glucose-Capillary: 134 mg/dL — ABNORMAL HIGH (ref 70–99)

## 2012-01-19 MED ORDER — IOHEXOL 300 MG/ML  SOLN
100.0000 mL | Freq: Once | INTRAMUSCULAR | Status: AC | PRN
  Administered 2012-01-19: 100 mL via INTRAVENOUS

## 2012-01-19 MED ORDER — THIAMINE HCL 100 MG/ML IJ SOLN
100.0000 mg | Freq: Every day | INTRAMUSCULAR | Status: DC
  Administered 2012-01-19 – 2012-01-22 (×4): 100 mg via INTRAVENOUS
  Filled 2012-01-19 (×5): qty 1

## 2012-01-19 MED ORDER — CYANOCOBALAMIN 1000 MCG/ML IJ SOLN
1000.0000 ug | Freq: Once | INTRAMUSCULAR | Status: AC
Start: 2012-01-19 — End: 2012-01-19
  Administered 2012-01-19: 1000 ug via INTRAMUSCULAR
  Filled 2012-01-19: qty 1

## 2012-01-19 MED ORDER — FOLIC ACID 5 MG/ML IJ SOLN
1.0000 mg | Freq: Every day | INTRAMUSCULAR | Status: DC
  Administered 2012-01-19 – 2012-01-22 (×4): 1 mg via INTRAVENOUS
  Filled 2012-01-19 (×5): qty 0.2

## 2012-01-19 MED ORDER — SODIUM CHLORIDE 0.9 % IV SOLN
INTRAVENOUS | Status: DC
  Administered 2012-01-19: 17:00:00 via INTRAVENOUS

## 2012-01-19 MED ORDER — SODIUM CHLORIDE 1 G PO TABS
2.0000 g | ORAL_TABLET | Freq: Three times a day (TID) | ORAL | Status: DC
  Administered 2012-01-19 – 2012-01-23 (×13): 2 g via ORAL
  Filled 2012-01-19 (×16): qty 2

## 2012-01-19 NOTE — Progress Notes (Signed)
Spoke with Pt's brother, Mr. Maggart, regarding information needed by the [place].  [facility] requesting a letter stating that Pt is currently admitted, psych MD's capacity eval, H&P,current prog note, along with the family contact's information be faxed to them to make a decision re: the government check that is being sent to Pt's girlfriend.  Mr. Nofziger (Pt's brother) requesting a copy of this letter.  Faxed aforementioned documents to [place].  Mailed letter to Mr. Zawistowski (Pt's brother).  [facility] willing to accept a LOG on Pt.  Notified MD and RN that Pt has a facility upon d/c and that Pt will need to be sitter-free for 24hrs before he can be admitted.  MD stated that Pt's sodium is coming up gradually and states that it may be ok to d/c sitter tomorrow (Saturday).  Providence Crosby, LCSWA Clinical Social Work [phone]

## 2012-01-19 NOTE — Progress Notes (Signed)
Lab called to report critical sodium of 119. MD notified via text page

## 2012-01-19 NOTE — Consult Note (Addendum)
Reason for Consult:hyponatremia Referring Physician: Penny Pia, MD  Edgar Vazquez is an 66 y.o. male.  HPI: Pt is a 66yo WM with PMH sig for tobacco and alcohol abuse, HTN, and alcoholic CMP who was admitted on 01/14/12 after swallowing a bottle full of lisinopril.  He denied any suicidal ideation but was found to lack capacity for personal care/decision making.  We were asked to see the patient after he has had progressive hyponatremia since admission.  The trend in Na is below.  Of note, the nursing staff reports more somnolence today and he has been having issues with short-term memory since his admission.  Sodium  Date/Time Value Range Status  01/19/2012 10:45 AM 119* 135 - 145 mEq/L Final  01/19/2012  6:44 AM 120* 135 - 145 mEq/L Final  01/19/2012  2:23 AM 120* 135 - 145 mEq/L Final  01/18/2012 10:20 PM 121* 135 - 145 mEq/L Final  01/18/2012  6:15 PM 119* 135 - 145 mEq/L Final  01/18/2012  2:30 PM 118* 135 - 145 mEq/L Final  01/18/2012 11:22 AM 119* 135 - 145 mEq/L Final  01/18/2012  6:32 AM 120* 135 - 145 mEq/L Final  01/18/2012  2:35 AM 120* 135 - 145 mEq/L Final  01/17/2012 10:40 PM 121* 135 - 145 mEq/L Final  01/17/2012  6:15 PM 122* 135 - 145 mEq/L Final  01/17/2012  2:12 PM 123* 135 - 145 mEq/L Final  01/16/2012 11:13 PM 120* 135 - 145 mEq/L Final  01/16/2012  9:25 AM 127* 135 - 145 mEq/L Final  01/15/2012  3:45 AM 129* 135 - 145 mEq/L Final  01/14/2012 10:25 PM 131* 135 - 145 mEq/L Final  11/19/2007 10:33 PM 141  135-145 meq/L Final  04/16/2007  4:00 AM 139   Final  04/13/2007  5:30 AM 138   Final  04/12/2007  4:30 AM 135   Final  04/11/2007  4:15 AM 136   Final  04/10/2007  1:20 AM 134*  Final  04/09/2007  3:59 PM 135   Final  04/07/2007  5:30 AM 137   Final  04/06/2007  1:45 PM 131*  Final   PMH:  History reviewed. No pertinent past medical history.  PSH:   Past Surgical History  Procedure Date  . Ankle surgery     Allergies:  Allergies  Allergen Reactions  . Penicillins      Medications:   Prior to Admission medications   Not on File    Discontinued Meds:   Medications Discontinued During This Encounter  Medication Reason  . thiamine 100 MG tablet Error  . sertraline (ZOLOFT) 50 MG tablet Error  . omeprazole (PRILOSEC) 20 MG capsule Error  . cloNIDine (CATAPRES - DOSED IN MG/24 HR) 0.2 mg/24hr patch Error  . folic acid (FOLVITE) 1 MG tablet Error  . albuterol (PROVENTIL HFA;VENTOLIN HFA) 108 (90 BASE) MCG/ACT inhaler Error  . 0.9 %  sodium chloride infusion   . hydrALAZINE (APRESOLINE) tablet 10 mg   . haloperidol lactate (HALDOL) injection 2 mg   . sodium chloride tablet 1 g   . hydrALAZINE (APRESOLINE) tablet 10 mg   . sodium chloride tablet 1 g   . potassium chloride SA (K-DUR,KLOR-CON) CR tablet 40 mEq     Social History:  reports that he has been smoking.  He does not have any smokeless tobacco history on file. He reports that he drinks alcohol. He reports that he does not use illicit drugs.  Family History:  No family history on file.  Review of systems not obtained due to patient factors.  Blood pressure 152/74, pulse 66, temperature 97.8 F (36.6 C), temperature source Oral, resp. rate 20, height 5\' 11"  (1.803 m), weight 76.431 kg (168 lb 8 oz), SpO2 100.00%. General appearance: distracted, no distress and slowed mentation Head: Normocephalic, without obvious abnormality, atraumatic, drooling down left side of face Eyes: negative findings: lids and lashes normal, conjunctivae and sclerae normal and corneas clear Throat: abnormal findings: drooling down left side of mouth Neck: no adenopathy, no carotid bruit, no JVD, supple, symmetrical, trachea midline and thyroid not enlarged, symmetric, no tenderness/mass/nodules Resp: clear to auscultation bilaterally Cardio: regular rate and rhythm, S1, S2 normal, no murmur, click, rub or gallop GI: soft, non-tender; bowel sounds normal; no masses,  no organomegaly Extremities: extremities  normal, atraumatic, no cyanosis or edema Neurologic: Mental status: orientation: person, place, thought content exhibits tangential connections, when questioned about suicide, the patient expresses no suicidal ideation, no recollection of suicide attempt Motor: unable to fully evaluate due to patient lack of cooperation with exam and need for frequent bathroom breaks  Labs: Basic Metabolic Panel:  Lab 01/19/12 1610 01/19/12 0644 01/19/12 0223 01/18/12 2220 01/18/12 1815 01/18/12 1430 01/18/12 1122 01/15/12 0345 01/14/12 2225  NA 119* 120* 120* 121* 119* 118* 119* -- --  K 5.2* 4.7 4.4 4.2 4.2 4.4 4.5 -- --  CL 84* 87* 87* 87* 85* 86* 86* -- --  CO2 24 24 22 23 23 22 23  -- --  GLUCOSE 95 96 90 99 89 102* 99 -- --  BUN 5* 4* 5* 5* 5* 5* 6 -- --  CREATININE 0.54 0.62 0.59 0.60 0.66 0.59 0.64 -- --  ALBUMIN -- -- -- -- -- -- -- 3.1* 3.7  CALCIUM 9.4 9.2 9.0 8.9 9.0 9.2 9.1 -- --  PHOS -- -- -- -- -- -- -- 3.0 --   Liver Function Tests:  Lab 01/15/12 0345 01/14/12 2225  AST 13 15  ALT 10 12  ALKPHOS 81 95  BILITOT 0.3 0.5  PROT 6.1 7.4  ALBUMIN 3.1* 3.7   No results found for this basename: LIPASE:3,AMYLASE:3 in the last 168 hours No results found for this basename: AMMONIA:3 in the last 168 hours CBC:  Lab 01/16/12 2313 01/15/12 0345 01/14/12 2225  WBC 8.3 5.7 6.4  NEUTROABS 6.3 3.7 --  HGB 12.8* 13.0 14.3  HCT 37.6* 40.0 42.2  MCV 89.7 93.2 91.7  PLT 225 234 244   PT/INR: @labrcntip (inr:5) Cardiac Enzymes:  Lab 01/15/12 1840 01/15/12 1040 01/15/12 0345  CKTOTAL 146 136 130  CKMB 5.9* 5.5* 5.0*  CKMBINDEX -- -- --  TROPONINI <0.30 <0.30 <0.30   CBG:  Lab 01/19/12 0833 01/19/12 0751 01/18/12 0731 01/17/12 0815 01/16/12 0736  GLUCAP 134* 53* 97 93 106*    Iron Studies: No results found for this basename: IRON:30,TIBC:30,TRANSFERRIN:30,FERRITIN:30 in the last 168 hours  Xrays/Other Studies: Dg Chest 2 View  01/19/2012  *RADIOLOGY REPORT*  Clinical Data: Abdominal  prior chest x-ray  CHEST - 2 VIEW  Comparison: 01/16/2012  Findings: Cardiomediastinal silhouette is stable.  No acute infiltrate or pulmonary edema.  Mild hyperinflation.  Mild left basilar atelectasis.  Nodular density projecting bilateral lower lobe may represent nipple shadow.  Repeat frontal view with nipple markers is recommended for confirmation.  Bony thorax is unremarkable.  IMPRESSION: No acute infiltrate or pulmonary edema.  Mild hyperinflation.  Mild left basilar atelectasis.  Nodular density projecting bilateral lower lobe may represent nipple shadow.  Repeat frontal  view with nipple markers is recommended for confirmation.  Original Report Authenticated By: Natasha Mead, M.D.     Assessment/Plan: 1.  Hyponatremia- has been progressive since admission.  Worse over the last 48 hours with decreased po intake.  Appears volume depleted to euvolemic.  Will check orthostatics on patient and check serum Na before IV NS infusion and follow closely.  Will not give hypertonic due to h/o CMP.  May need to start samsca 15mg  orally if Na drops after saline.  Will recheck Una and Uosm before initiation of IVF's as his only Neomia Dear was performed after 2 days of IVF's with NS and may have been falsely elevated.  Will follow closely and have a low threshold for starting samsca.  SIADH is high on differential and given his drinking and smoking history, would image his chest to r/o malignancy.  TSH pending and will add cortisol 2. AMS- ?related to hyponatremia or psychiatric or CVA or alcoholic encephelopathy.  Will give b12, folate and thiamine and check CT of head. 3. HTN- stable hold ace I 4. Psych- ?suicide attempt, Psych following. 5. Disposition- will need placement in SNF when stable   Elmus Mathes A 01/19/2012, 3:14 PM  Would also consider Neuro evaluation if AMS persists after serum Na is corrected.

## 2012-01-19 NOTE — Progress Notes (Signed)
Subjective: Patient reportedly continue to have poor oral intake.  When I discussed this with patient he mentioned that he can not chew his food all too well and as such he states that this is why he is not eating much.  Denies any fever, chills, nausea, emesis, chest pain, cough, SOB.  Objective: Filed Vitals:   01/18/12 0551 01/18/12 1434 01/18/12 2209 01/19/12 0531  BP: 127/73 134/85 145/89 163/89  Pulse: 67 73 77 64  Temp: 98.4 F (36.9 C) 98.3 F (36.8 C) 97.7 F (36.5 C) 97.5 F (36.4 C)  TempSrc: Oral Oral Oral Oral  Resp: 19 20 18 18   Height:      Weight: 76.431 kg (168 lb 8 oz)     SpO2: 95% 96% 94% 98%   Weight change:   Intake/Output Summary (Last 24 hours) at 01/19/12 1307 Last data filed at 01/19/12 0900  Gross per 24 hour  Intake    360 ml  Output      0 ml  Net    360 ml    General: Alert, awake, in no acute distress.  HEENT: No bruits, no goiter.  Heart: Regular rate and rhythm, without murmurs, rubs, gallops.  Lungs: Clear to auscultation, bilateral air movement.  Abdomen: Soft, nontender, nondistended, positive bowel sounds.  Neuro: Patient responds to questions appropriately, moves all extremities.   Lab Results:  Basename 01/19/12 1045 01/19/12 0644  NA 119* 120*  K 5.2* 4.7  CL 84* 87*  CO2 24 24  GLUCOSE 95 96  BUN 5* 4*  CREATININE 0.54 0.62  CALCIUM 9.4 9.2  MG -- --  PHOS -- --   No results found for this basename: AST:2,ALT:2,ALKPHOS:2,BILITOT:2,PROT:2,ALBUMIN:2 in the last 72 hours No results found for this basename: LIPASE:2,AMYLASE:2 in the last 72 hours  Basename 01/16/12 2313  WBC 8.3  NEUTROABS 6.3  HGB 12.8*  HCT 37.6*  MCV 89.7  PLT 225   No results found for this basename: CKTOTAL:3,CKMB:3,CKMBINDEX:3,TROPONINI:3 in the last 72 hours No components found with this basename: POCBNP:3 No results found for this basename: DDIMER:2 in the last 72 hours No results found for this basename: HGBA1C:2 in the last 72 hours No  results found for this basename: CHOL:2,HDL:2,LDLCALC:2,TRIG:2,CHOLHDL:2,LDLDIRECT:2 in the last 72 hours No results found for this basename: TSH,T4TOTAL,FREET3,T3FREE,THYROIDAB in the last 72 hours No results found for this basename: VITAMINB12:2,FOLATE:2,FERRITIN:2,TIBC:2,IRON:2,RETICCTPCT:2 in the last 72 hours  Micro Results: Recent Results (from the past 240 hour(s))  MRSA PCR SCREENING     Status: Normal   Collection Time   01/15/12  2:31 AM      Component Value Range Status Comment   MRSA by PCR NEGATIVE  NEGATIVE Final   CULTURE, BLOOD (ROUTINE X 2)     Status: Normal (Preliminary result)   Collection Time   01/16/12 11:04 PM      Component Value Range Status Comment   Specimen Description BLOOD RIGHT ANTECUBITAL   Final    Special Requests BOTTLES DRAWN AEROBIC AND ANAEROBIC 10CC   Final    Culture  Setup Time [id number]   Final    Culture     Final    Value:        BLOOD CULTURE RECEIVED NO GROWTH TO DATE CULTURE WILL BE HELD FOR 5 DAYS BEFORE ISSUING A FINAL NEGATIVE REPORT   Report Status PENDING   Incomplete   CULTURE, BLOOD (ROUTINE X 2)     Status: Normal (Preliminary result)   Collection Time   01/16/12  11:13 PM      Component Value Range Status Comment   Specimen Description BLOOD LEFT HAND   Final    Special Requests BOTTLES DRAWN AEROBIC AND ANAEROBIC 4CC   Final    Culture  Setup Time [id number]   Final    Culture     Final    Value:        BLOOD CULTURE RECEIVED NO GROWTH TO DATE CULTURE WILL BE HELD FOR 5 DAYS BEFORE ISSUING A FINAL NEGATIVE REPORT   Report Status PENDING   Incomplete   URINE CULTURE     Status: Normal   Collection Time   01/17/12  1:35 PM      Component Value Range Status Comment   Specimen Description URINE, CLEAN CATCH   Final    Special Requests Normal   Final    Culture  Setup Time [id number]   Final    Colony Count 30,000 COLONIES/ML   Final    Culture     Final    Value: Multiple bacterial morphotypes present, none predominant.  Suggest appropriate recollection if clinically indicated.   Report Status 01/18/2012 FINAL   Final     Studies/Results: Dg Chest 2 View  01/19/2012  *RADIOLOGY REPORT*  Clinical Data: Abdominal prior chest x-ray  CHEST - 2 VIEW  Comparison: 01/16/2012  Findings: Cardiomediastinal silhouette is stable.  No acute infiltrate or pulmonary edema.  Mild hyperinflation.  Mild left basilar atelectasis.  Nodular density projecting bilateral lower lobe may represent nipple shadow.  Repeat frontal view with nipple markers is recommended for confirmation.  Bony thorax is unremarkable.  IMPRESSION: No acute infiltrate or pulmonary edema.  Mild hyperinflation.  Mild left basilar atelectasis.  Nodular density projecting bilateral lower lobe may represent nipple shadow.  Repeat frontal view with nipple markers is recommended for confirmation.  Original Report Authenticated By: Natasha Mead, M.D.    Medications: I have reviewed the patient's current medications.  Patient Active Hospital Problem List: Polysubstance abuse (01/16/2012)  -Psych on board will f/u with their recommendation and recommend cessation.  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  - fluid restrict patient to 800 ml/day  - lasix 20 mg bid  - Given no improvement will add salt tablets. Should patient continue to get worse or not show any improvement would plan on consulting nephrology.  - Check BMP and avoid rapid correction of >8 meq of Na in a 24 hour period.   Will follow up with Nephrology's recommendations.  I appreciate Nephrology's help with this case.  At this point suspect that poor oral intake is also playing a role.  I have changed patient's diet to dysphagia pureed diet.    HYPERTENSION, BENIGN ESSENTIAL (11/19/2007) Not well controlled today.  Will continue to monitor and if BP reading stay elevated or go up higher  will plan on adding another antihypertensive medication     LOS: 5 days   Penny Pia M.D.  [facility] 01/19/2012, 1:07 PM

## 2012-01-20 DIAGNOSIS — E876 Hypokalemia: Secondary | ICD-10-CM

## 2012-01-20 DIAGNOSIS — F0390 Unspecified dementia without behavioral disturbance: Secondary | ICD-10-CM

## 2012-01-20 DIAGNOSIS — E871 Hypo-osmolality and hyponatremia: Secondary | ICD-10-CM

## 2012-01-20 DIAGNOSIS — F1910 Other psychoactive substance abuse, uncomplicated: Secondary | ICD-10-CM

## 2012-01-20 LAB — BASIC METABOLIC PANEL
BUN: 4 mg/dL — ABNORMAL LOW (ref 6–23)
BUN: 6 mg/dL (ref 6–23)
CO2: 23 mEq/L (ref 19–32)
CO2: 22 mEq/L (ref 19–32)
CO2: 25 mEq/L (ref 19–32)
Calcium: 9.1 mg/dL (ref 8.4–10.5)
Calcium: 9.3 mg/dL (ref 8.4–10.5)
Calcium: 8.8 mg/dL (ref 8.4–10.5)
Calcium: 9.2 mg/dL (ref 8.4–10.5)
Chloride: 86 mEq/L — ABNORMAL LOW (ref 96–112)
Chloride: 92 mEq/L — ABNORMAL LOW (ref 96–112)
Chloride: 92 mEq/L — ABNORMAL LOW (ref 96–112)
Creatinine, Ser: 0.66 mg/dL (ref 0.50–1.35)
Creatinine, Ser: 0.66 mg/dL (ref 0.50–1.35)
Creatinine, Ser: 0.74 mg/dL (ref 0.50–1.35)
GFR calc Af Amer: >90 mL/min (ref >90)
GFR calc Af Amer: >90 mL/min (ref >90)
GFR calc Af Amer: >90 mL/min (ref >90)
GFR calc Af Amer: >90 mL/min (ref >90)
GFR calc Af Amer: >90 mL/min (ref >90)
GFR calc Af Amer: >90 mL/min (ref >90)
GFR calc non Af Amer: >90 mL/min (ref >90)
GFR calc non Af Amer: >90 mL/min (ref >90)
GFR calc non Af Amer: >90 mL/min (ref >90)
GFR calc non Af Amer: >90 mL/min (ref >90)
Glucose, Bld: 108 mg/dL — ABNORMAL HIGH (ref 70–99)
Potassium: 5.3 mEq/L — ABNORMAL HIGH (ref 3.5–5.1)
Potassium: 4.5 mEq/L (ref 3.5–5.1)
Sodium: 120 mEq/L — ABNORMAL LOW (ref 135–145)
Sodium: 123 mEq/L — ABNORMAL LOW (ref 135–145)
Sodium: 123 mEq/L — ABNORMAL LOW (ref 135–145)
Sodium: 130 mEq/L — ABNORMAL LOW (ref 135–145)
Sodium: 131 mEq/L — ABNORMAL LOW (ref 135–145)

## 2012-01-20 LAB — SODIUM, URINE, RANDOM: Sodium, Ur: 141 mEq/L

## 2012-01-20 LAB — OSMOLALITY, URINE: Osmolality, Ur: 455 mOsm/kg (ref 390–1090)

## 2012-01-20 LAB — PHOSPHORUS: Phosphorus: 3.7 mg/dL (ref 2.3–4.6)

## 2012-01-20 LAB — VITAMIN B12: Vitamin B-12: >2000 pg/mL — ABNORMAL HIGH (ref 211–911)

## 2012-01-20 LAB — ALBUMIN: Albumin: 3.5 g/dL (ref 3.5–5.2)

## 2012-01-20 MED ORDER — AMLODIPINE BESYLATE 5 MG PO TABS
5.0000 mg | ORAL_TABLET | Freq: Every day | ORAL | Status: DC
  Administered 2012-01-20 – 2012-01-23 (×4): 5 mg via ORAL
  Filled 2012-01-20 (×4): qty 1

## 2012-01-20 MED ORDER — TOLVAPTAN 15 MG PO TABS
15.0000 mg | ORAL_TABLET | Freq: Every day | ORAL | Status: DC
  Administered 2012-01-20: 15 mg via ORAL
  Filled 2012-01-20 (×2): qty 1

## 2012-01-20 MED ORDER — ENSURE COMPLETE PO LIQD
237.0000 mL | Freq: Two times a day (BID) | ORAL | Status: DC
  Administered 2012-01-20 – 2012-01-23 (×7): 237 mL via ORAL

## 2012-01-20 MED ORDER — HYDRALAZINE HCL 20 MG/ML IJ SOLN: 10.0000 mg | Freq: Three times a day (TID) | INTRAMUSCULAR | Status: DC | PRN

## 2012-01-20 NOTE — Progress Notes (Signed)
Patient ID: Edgar Vazquez, male   DOB: August 28, 1945, 66 y.o.   MRN: [id number] S:pt lying comfortably in bed, no complaints but only oriented to person and ?place "[facility]".  Does not know why he is here nor does he remember swallowing bottle of lisinopril. O:BP 182/71  Pulse 85  Temp 97.6 F (36.4 C) (Oral)  Resp 21  Ht 5\' 11"  (1.803 m)  Wt 74.98 kg (165 lb 4.8 oz)  BMI 23.05 kg/m2  SpO2 100%  Intake/Output Summary (Last 24 hours) at 01/20/12 0916 Last data filed at 01/20/12 4098  Gross per 24 hour  Intake   1080 ml  Output   1375 ml  Net   -295 ml   Weight change:  JXB:JYNWGNFAO WM in NAD CVS:RRR Resp:occ rhonchi ZHY:QMVHQI Ext:no edema   Lab 01/20/12 0622 01/20/12 0619 01/20/12 0232 01/19/12 2245 01/19/12 2055 01/19/12 1825 01/19/12 1441 01/19/12 1045 01/15/12 0345 01/14/12 2225  NA 121* -- 120* 120* 121* 119* 120* 119* -- --  K 4.9 -- 4.4 4.4 4.5 4.5 4.7 5.2* -- --  CL 86* -- 86* 86* 85* 83* 86* 84* -- --  CO2 22 -- 23 20 23 22 22 24  -- --  GLUCOSE 88 -- 86 78 82 82 103* 95 -- --  BUN 4* -- 5* 5* 6 6 6  5* -- --  CREATININE 0.66 -- 0.65 0.58 0.65 0.62 0.57 0.54 -- --  ALBUMIN -- 3.5 -- -- 3.4* 3.6 -- -- 3.1* 3.7  CALCIUM 9.3 -- 9.1 9.0 9.1 9.3 9.4 9.4 -- --  PHOS -- 3.7 -- -- 3.6 2.7 -- -- 3.0 --  AST -- -- -- -- -- -- -- -- 13 15  ALT -- -- -- -- -- -- -- -- 10 12   Liver Function Tests:  Lab 01/20/12 0619 01/19/12 2055 01/19/12 1825 01/15/12 0345 01/14/12 2225  AST -- -- -- 13 15  ALT -- -- -- 10 12  ALKPHOS -- -- -- 81 95  BILITOT -- -- -- 0.3 0.5  PROT -- -- -- 6.1 7.4  ALBUMIN 3.5 3.4* 3.6 -- --   No results found for this basename: LIPASE:3,AMYLASE:3 in the last 168 hours No results found for this basename: AMMONIA:3 in the last 168 hours CBC:  Lab 01/16/12 2313 01/15/12 0345 01/14/12 2225  WBC 8.3 5.7 6.4  NEUTROABS 6.3 3.7 --  HGB 12.8* 13.0 14.3  HCT 37.6* 40.0 42.2  MCV 89.7 93.2 91.7  PLT 225 234 244   Cardiac Enzymes:  Lab 01/15/12  1840 01/15/12 1040 01/15/12 0345  CKTOTAL 146 136 130  CKMB 5.9* 5.5* 5.0*  CKMBINDEX -- -- --  TROPONINI <0.30 <0.30 <0.30   CBG:  Lab 01/19/12 0833 01/19/12 0751 01/18/12 0731 01/17/12 0815 01/16/12 0736  GLUCAP 134* 53* 97 93 106*    Iron Studies: No results found for this basename: IRON,TIBC,TRANSFERRIN,FERRITIN in the last 72 hours Studies/Results: Dg Chest 2 View  01/19/2012  *RADIOLOGY REPORT*  Clinical Data: Abdominal prior chest x-ray  CHEST - 2 VIEW  Comparison: 01/16/2012  Findings: Cardiomediastinal silhouette is stable.  No acute infiltrate or pulmonary edema.  Mild hyperinflation.  Mild left basilar atelectasis.  Nodular density projecting bilateral lower lobe may represent nipple shadow.  Repeat frontal view with nipple markers is recommended for confirmation.  Bony thorax is unremarkable.  IMPRESSION: No acute infiltrate or pulmonary edema.  Mild hyperinflation.  Mild left basilar atelectasis.  Nodular density projecting bilateral lower lobe may represent nipple shadow.  Repeat frontal view with nipple markers is recommended for confirmation.  Original Report Authenticated By: Natasha Mead, M.D.   Ct Head Wo Contrast  01/19/2012  *RADIOLOGY REPORT*  Clinical Data: Altered mental status.  Drooling.  Hyponatremia.  CT HEAD WITHOUT CONTRAST  Technique:  Contiguous axial images were obtained from the base of the skull through the vertex without contrast.  Comparison: 04/06/2007  Findings: Lacunar infarct or non-dilated perivascular space noted in the left lentiform nucleus; this is well defined and appears chronic.  Otherwise, the brain stem, cerebellum, cerebral peduncles, thalami, basal ganglia, basilar cisterns, and ventricular system appear unremarkable.  Periventricular and corona radiata white matter hypodensities are most compatible with chronic ischemic microvascular white matter disease.  No intracranial hemorrhage, mass lesion, or acute infarction is identified.  Large plugs of  cerumen occlude the external auditory canals.  These could significantly affect auditory acuity.  IMPRESSION:  1.  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 2.  Periventricular and corona radiata white matter hypodensities are most compatible with chronic ischemic microvascular white matter disease. 3.  No acute intracranial findings 4.  Large plugs of cerumen in the external auditory canals could significantly degrade auditory acuity.  Original Report Authenticated By: Dellia Cloud, M.D.   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  CT CHEST, ABDOMEN AND PELVIS WITH CONTRAST  Technique:  Multidetector CT imaging of the chest, abdomen and pelvis was performed following the standard protocol during bolus administration of intravenous contrast.  Contrast: OMNIPAQUE IOHEXOL 300 MG/ML  SOLN  Comparison:  No priors.  CT CHEST  Findings:  Mediastinum: Heart size is normal. Small amount of pericardial fluid and/or thickening.  No associated pericardial calcification. Mild ectasia of the descending thoracic aorta (3.9 cm in diameter). There is atherosclerosis of the thoracic aorta, the great vessels of the mediastinum and the coronary arteries, including calcified atherosclerotic plaque in the left main, left anterior descending and right coronary arteries. No pathologically enlarged mediastinal or hilar lymph nodes. Esophagus is unremarkable in appearance. Separate origin of the left vertebral artery directly off the aortic arch is incidentally noted (a normal anatomical variant).  Lungs/Pleura: There are multiple small pulmonary nodules in the lungs bilaterally, the largest of which is 7 mm in diameter in the apex of the right upper lobe (image 16 of series 5).  The largest nodule in the left lung is a 5 mm nodule in the left upper lobe (image 26 of series 5).  No acute consolidative airspace  disease. No pleural effusions.  Musculoskeletal: There are no aggressive appearing lytic or blastic lesions noted in the visualized portions of the skeleton.  IMPRESSION:  1.  Multiple small pulmonary nodules in the lungs bilaterally, largest of which measures 7 mm in diameter in the apex of the right upper lobe. If the patient is at high risk for bronchogenic carcinoma, follow-up chest CT at 3-6 months is recommended.  If the patient is at low risk for bronchogenic carcinoma, follow-up chest CT at 6-12 months is recommended.  This recommendation follows the consensus statement: Guidelines for Management of Small Pulmonary Nodules Detected on CT Scans: A Statement from the Fleischner Society as published in Radiology 2005; 237:395-400. 2. Atherosclerosis, including left main and two-vessel coronary artery disease.   Assessment for potential risk factor modification, dietary therapy or pharmacologic therapy may be warranted, if clinically indicated. 3.  Small amount of  pericardial fluid and/or thickening.  This is unlikely to be of hemodynamic significance at this time.  No associated pericardial calcification.  CT ABDOMEN AND PELVIS  Findings:  Abdomen/Pelvis: Small gallstones layering dependently in the lumen of the gallbladder.  No signs of acute cholecystitis at this time. The appearance of the liver and bilateral adrenal glands is unremarkable.  There is a small calcification in the spleen likely represent a calcified granuloma.  In the head and the body of the pancreas there are a few punctate calcifications, suggesting sequela of mild chronic pancreatitis.  One of these calcifications, is in close proximity to the distal pancreatic duct, however, the duct is within normal caliber measuring 3 mm.  There are low attenuation lesions in the kidneys bilaterally, the largest which measures 1.2 cm extending exophytically from the lower pole of the right kidney, compatible with a simple cyst.  Other lesions are too  small to definitively characterize (statistically likely represent small cyst).  Extensive atherosclerosis of the abdominal and pelvic vasculature, without definite aneurysm or dissection.  No ascites or pneumoperitoneum and no pathologic distension of bowel.  No definite pathologic lymphadenopathy identified within the abdomen or pelvis.  The appendix is normal.  There is a focal area of potential colonic wall thickening in the sigmoid colon (best demonstrated on image 115 of series 2).  Numerous colonic diverticula are noted without surrounding inflammatory changes to suggest an acute diverticulitis at this time.  Small right inguinal hernia containing only fat is incidentally noted.  Musculoskeletal: There are no aggressive appearing lytic or blastic lesions noted in the visualized portions of the skeleton.  There is a mild compression fracture of a L1 with approximately 20% loss of anterior vertebral body height (likely remote).  IMPRESSION:  1.  Focal area of potential colonic wall thickening in the mid sigmoid colon.  This could simply represent an area of scarring from prior diverticulitis (no current evidence of acute diverticulitis despite the presence of diverticular disease), however, the possibility of a colonic wall neoplasm is not excluded.  Further evaluation with sigmoidoscopy or colonoscopy may be warranted if clinically indicated. 2.  Multiple small calcifications in the pancreas, as above, favored to represent sequelae of chronic pancreatitis. 3.  Cholelithiasis without evidence to suggest acute cholecystitis. 4.  Low attenuation lesions in the kidneys bilaterally.  The largest one on the right as compatible with a simple cyst.  Lesions in the left kidney are too small to definitively characterize, but statistically favored to represent a small cyst. 5.  Additional incidental findings, as above.  Original Report Authenticated By: Florencia Reasons, M.D.   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  CT CHEST, ABDOMEN AND PELVIS WITH CONTRAST  Technique:  Multidetector CT imaging of the chest, abdomen and pelvis was performed following the standard protocol during bolus administration of intravenous contrast.  Contrast: OMNIPAQUE IOHEXOL 300 MG/ML  SOLN  Comparison:  No priors.  CT CHEST  Findings:  Mediastinum: Heart size is normal. Small amount of pericardial fluid and/or thickening.  No associated pericardial calcification. Mild ectasia of the descending thoracic aorta (3.9 cm in diameter). There is atherosclerosis of the thoracic aorta, the great vessels of the mediastinum and the coronary arteries, including calcified atherosclerotic plaque in the left main, left anterior descending and right coronary arteries. No pathologically enlarged mediastinal or hilar lymph nodes. Esophagus is unremarkable in appearance. Separate origin of the left  vertebral artery directly off the aortic arch is incidentally noted (a normal anatomical variant).  Lungs/Pleura: There are multiple small pulmonary nodules in the lungs bilaterally, the largest of which is 7 mm in diameter in the apex of the right upper lobe (image 16 of series 5).  The largest nodule in the left lung is a 5 mm nodule in the left upper lobe (image 26 of series 5).  No acute consolidative airspace disease. No pleural effusions.  Musculoskeletal: There are no aggressive appearing lytic or blastic lesions noted in the visualized portions of the skeleton.  IMPRESSION:  1.  Multiple small pulmonary nodules in the lungs bilaterally, largest of which measures 7 mm in diameter in the apex of the right upper lobe. If the patient is at high risk for bronchogenic carcinoma, follow-up chest CT at 3-6 months is recommended.  If the patient is at low risk for bronchogenic carcinoma, follow-up chest CT at 6-12 months is recommended.  This recommendation follows the consensus  statement: Guidelines for Management of Small Pulmonary Nodules Detected on CT Scans: A Statement from the Fleischner Society as published in Radiology 2005; 237:395-400. 2. Atherosclerosis, including left main and two-vessel coronary artery disease.   Assessment for potential risk factor modification, dietary therapy or pharmacologic therapy may be warranted, if clinically indicated. 3.  Small amount of pericardial fluid and/or thickening.  This is unlikely to be of hemodynamic significance at this time.  No associated pericardial calcification.  CT ABDOMEN AND PELVIS  Findings:  Abdomen/Pelvis: Small gallstones layering dependently in the lumen of the gallbladder.  No signs of acute cholecystitis at this time. The appearance of the liver and bilateral adrenal glands is unremarkable.  There is a small calcification in the spleen likely represent a calcified granuloma.  In the head and the body of the pancreas there are a few punctate calcifications, suggesting sequela of mild chronic pancreatitis.  One of these calcifications, is in close proximity to the distal pancreatic duct, however, the duct is within normal caliber measuring 3 mm.  There are low attenuation lesions in the kidneys bilaterally, the largest which measures 1.2 cm extending exophytically from the lower pole of the right kidney, compatible with a simple cyst.  Other lesions are too small to definitively characterize (statistically likely represent small cyst).  Extensive atherosclerosis of the abdominal and pelvic vasculature, without definite aneurysm or dissection.  No ascites or pneumoperitoneum and no pathologic distension of bowel.  No definite pathologic lymphadenopathy identified within the abdomen or pelvis.  The appendix is normal.  There is a focal area of potential colonic wall thickening in the sigmoid colon (best demonstrated on image 115 of series 2).  Numerous colonic diverticula are noted without surrounding inflammatory changes to  suggest an acute diverticulitis at this time.  Small right inguinal hernia containing only fat is incidentally noted.  Musculoskeletal: There are no aggressive appearing lytic or blastic lesions noted in the visualized portions of the skeleton.  There is a mild compression fracture of a L1 with approximately 20% loss of anterior vertebral body height (likely remote).  IMPRESSION:  1.  Focal area of potential colonic wall thickening in the mid sigmoid colon.  This could simply represent an area of scarring from prior diverticulitis (no current evidence of acute diverticulitis despite the presence of diverticular disease), however, the possibility of a colonic wall neoplasm is not excluded.  Further evaluation with sigmoidoscopy or colonoscopy may be warranted if clinically indicated. 2.  Multiple small calcifications in  the pancreas, as above, favored to represent sequelae of chronic pancreatitis. 3.  Cholelithiasis without evidence to suggest acute cholecystitis. 4.  Low attenuation lesions in the kidneys bilaterally.  The largest one on the right as compatible with a simple cyst.  Lesions in the left kidney are too small to definitively characterize, but statistically favored to represent a small cyst. 5.  Additional incidental findings, as above.  Original Report Authenticated By: Florencia Reasons, M.D.      . cloNIDine  0.2 mg Transdermal Weekly  . cyanocobalamin  1,000 mcg Intramuscular Once  . enoxaparin  40 mg Subcutaneous Q24H  . folic acid  1 mg Intravenous Daily  . furosemide  20 mg Oral BID  . memantine  10 mg Oral BID  . nicotine  14 mg Transdermal Daily  . psyllium  1 packet Oral Daily  . sodium chloride  3 mL Intravenous Q12H  . sodium chloride  2 g Oral TID WC  . thiamine  100 mg Intravenous Daily  . DISCONTD: potassium chloride  40 mEq Oral BID    BMET    Component Value Date/Time   NA 121* 01/20/2012 0622   K 4.9 01/20/2012 0622   CL 86* 01/20/2012 0622   CO2 22 01/20/2012 0622    GLUCOSE 88 01/20/2012 0622   BUN 4* 01/20/2012 0622   CREATININE 0.66 01/20/2012 0622   CALCIUM 9.3 01/20/2012 0622   GFRNONAA >90 01/20/2012 0622   GFRAA >90 01/20/2012 0622   CBC    Component Value Date/Time   WBC 8.3 01/16/2012 2313   RBC 4.19* 01/16/2012 2313   HGB 12.8* 01/16/2012 2313   HCT 37.6* 01/16/2012 2313   PLT 225 01/16/2012 2313   MCV 89.7 01/16/2012 2313   MCH 30.5 01/16/2012 2313   MCHC 34.0 01/16/2012 2313   RDW 13.5 01/16/2012 2313   LYMPHSABS 1.1 01/16/2012 2313   MONOABS 0.8 01/16/2012 2313   EOSABS 0.0 01/16/2012 2313   BASOSABS 0.0 01/16/2012 2313     Assessment/Plan:  1. 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  Pt with multiple bilateral pulmonary nodules concerning for bronchogenic carcinoma in a smoker/drinker.  Would stop IVF's and start samsca 15mg  daily, remove fluid restriction, follow Na closely and consult pulmonology.  Also need to improve nutrition as patient is not eating and may further contribute to hyponatremia  2. AMS- ?psychiatric vs etoh encephelopathy vs metabolic encephelopathy- CT of brain showed old lacunar infarct and chronic ischemic changes. Consider Neuro evaluation 3. HTN- stop IVF's and add amlodipine 10mg  to regimen.  D/c lasix 4.  malnutrition- start protein supplements 5. Psych- ?suicide attempt, Psych following.  6. Disposition- will need placement in SNF when stable   Issac Moure A

## 2012-01-20 NOTE — Progress Notes (Signed)
Subjective: Patient continue with poor intake; No suicidal ideation or hallucinations. Per staff less somnolent/lethargic and was AAOX3 on exam today. Denies fever, chills, nausea, vomiting, chest pain and SOB.  Objective: Filed Vitals:   01/19/12 1658 01/19/12 1700 01/19/12 2212 01/20/12 0614  BP: 159/105 129/85 172/104 182/71  Pulse:   88 85  Temp:    97.6 F (36.4 C)  TempSrc:    Oral  Resp:   22 21  Height:      Weight:    74.98 kg (165 lb 4.8 oz)  SpO2:   100% 100%   Weight change:   Intake/Output Summary (Last 24 hours) at 01/20/12 1155 Last data filed at 01/20/12 1134  Gross per 24 hour  Intake   1080 ml  Output   1675 ml  Net   -595 ml    General: Alert, awake, in no acute distress.  HEENT: No bruits, no goiter.  Heart: Regular rate and rhythm, without murmurs, rubs, gallops.  Lungs: Clear to auscultation, bilateral air movement.  Abdomen: Soft, nontender, nondistended, positive bowel sounds.  Neuro: Patient responds to questions appropriately, moves all extremities.  Lab Results:  Basename 01/20/12 1033 01/20/12 0622 01/20/12 0619 01/19/12 2055  NA 123* 121* -- --  K 4.6 4.9 -- --  CL 88* 86* -- --  CO2 22 22 -- --  GLUCOSE 87 88 -- --  BUN 5* 4* -- --  CREATININE 0.66 0.66 -- --  CALCIUM 8.8 9.3 -- --  MG -- -- -- --  PHOS -- -- 3.7 3.6    Basename 01/20/12 0619 01/19/12 2055  AST -- --  ALT -- --  ALKPHOS -- --  BILITOT -- --  PROT -- --  ALBUMIN 3.5 3.4*    Basename 01/19/12 1825  VITAMINB12 >2000*  FOLATE --  FERRITIN --  TIBC --  IRON --  RETICCTPCT --    Micro Results: Recent Results (from the past 240 hour(s))  MRSA PCR SCREENING     Status: Normal   Collection Time   01/15/12  2:31 AM      Component Value Range Status Comment   MRSA by PCR NEGATIVE  NEGATIVE Final   CULTURE, BLOOD (ROUTINE X 2)     Status: Normal (Preliminary result)   Collection Time   01/16/12 11:04 PM      Component Value Range Status Comment   Specimen  Description BLOOD RIGHT ANTECUBITAL   Final    Special Requests BOTTLES DRAWN AEROBIC AND ANAEROBIC 10CC   Final    Culture  Setup Time [id number]   Final    Culture     Final    Value:        BLOOD CULTURE RECEIVED NO GROWTH TO DATE CULTURE WILL BE HELD FOR 5 DAYS BEFORE ISSUING A FINAL NEGATIVE REPORT   Report Status PENDING   Incomplete   CULTURE, BLOOD (ROUTINE X 2)     Status: Normal (Preliminary result)   Collection Time   01/16/12 11:13 PM      Component Value Range Status Comment   Specimen Description BLOOD LEFT HAND   Final    Special Requests BOTTLES DRAWN AEROBIC AND ANAEROBIC 4CC   Final    Culture  Setup Time [id number]   Final    Culture     Final    Value:        BLOOD CULTURE RECEIVED NO GROWTH TO DATE CULTURE WILL BE HELD FOR 5 DAYS BEFORE ISSUING A FINAL  NEGATIVE REPORT   Report Status PENDING   Incomplete   URINE CULTURE     Status: Normal   Collection Time   01/17/12  1:35 PM      Component Value Range Status Comment   Specimen Description URINE, CLEAN CATCH   Final    Special Requests Normal   Final    Culture  Setup Time [id number]   Final    Colony Count 30,000 COLONIES/ML   Final    Culture     Final    Value: Multiple bacterial morphotypes present, none predominant. Suggest appropriate recollection if clinically indicated.   Report Status 01/18/2012 FINAL   Final     Studies/Results: Dg Chest 2 View  01/19/2012  *RADIOLOGY REPORT*  Clinical Data: Abdominal prior chest x-ray  CHEST - 2 VIEW  Comparison: 01/16/2012  Findings: Cardiomediastinal silhouette is stable.  No acute infiltrate or pulmonary edema.  Mild hyperinflation.  Mild left basilar atelectasis.  Nodular density projecting bilateral lower lobe may represent nipple shadow.  Repeat frontal view with nipple markers is recommended for confirmation.  Bony thorax is unremarkable.  IMPRESSION: No acute infiltrate or pulmonary edema.  Mild hyperinflation.  Mild left basilar atelectasis.  Nodular density  projecting bilateral lower lobe may represent nipple shadow.  Repeat frontal view with nipple markers is recommended for confirmation.  Original Report Authenticated By: Natasha Mead, M.D.   Ct Head Wo Contrast  01/19/2012  *RADIOLOGY REPORT*  Clinical Data: Altered mental status.  Drooling.  Hyponatremia.  CT HEAD WITHOUT CONTRAST  Technique:  Contiguous axial images were obtained from the base of the skull through the vertex without contrast.  Comparison: 04/06/2007  Findings: Lacunar infarct or non-dilated perivascular space noted in the left lentiform nucleus; this is well defined and appears chronic.  Otherwise, the brain stem, cerebellum, cerebral peduncles, thalami, basal ganglia, basilar cisterns, and ventricular system appear unremarkable.  Periventricular and corona radiata white matter hypodensities are most compatible with chronic ischemic microvascular white matter disease.  No intracranial hemorrhage, mass lesion, or acute infarction is identified.  Large plugs of cerumen occlude the external auditory canals.  These could significantly affect auditory acuity.  IMPRESSION:  1.  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 2.  Periventricular and corona radiata white matter hypodensities are most compatible with chronic ischemic microvascular white matter disease. 3.  No acute intracranial findings 4.  Large plugs of cerumen in the external auditory canals could significantly degrade auditory acuity.  Original Report Authenticated By: Dellia Cloud, M.D.   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  CT CHEST, ABDOMEN AND PELVIS WITH CONTRAST  Technique:  Multidetector CT imaging of the chest, abdomen and pelvis was performed following the standard protocol during bolus administration of intravenous contrast.  Contrast: OMNIPAQUE IOHEXOL 300 MG/ML  SOLN  Comparison:  No priors.   CT CHEST  Findings:  Mediastinum: Heart size is normal. Small amount of pericardial fluid and/or thickening.  No associated pericardial calcification. Mild ectasia of the descending thoracic aorta (3.9 cm in diameter). There is atherosclerosis of the thoracic aorta, the great vessels of the mediastinum and the coronary arteries, including calcified atherosclerotic plaque in the left main, left anterior descending and right coronary arteries. No pathologically enlarged mediastinal or hilar lymph nodes. Esophagus is unremarkable in appearance. Separate origin of the left vertebral artery directly off the aortic arch is incidentally noted (  a normal anatomical variant).  Lungs/Pleura: There are multiple small pulmonary nodules in the lungs bilaterally, the largest of which is 7 mm in diameter in the apex of the right upper lobe (image 16 of series 5).  The largest nodule in the left lung is a 5 mm nodule in the left upper lobe (image 26 of series 5).  No acute consolidative airspace disease. No pleural effusions.  Musculoskeletal: There are no aggressive appearing lytic or blastic lesions noted in the visualized portions of the skeleton.  IMPRESSION:  1.  Multiple small pulmonary nodules in the lungs bilaterally, largest of which measures 7 mm in diameter in the apex of the right upper lobe. If the patient is at high risk for bronchogenic carcinoma, follow-up chest CT at 3-6 months is recommended.  If the patient is at low risk for bronchogenic carcinoma, follow-up chest CT at 6-12 months is recommended.  This recommendation follows the consensus statement: Guidelines for Management of Small Pulmonary Nodules Detected on CT Scans: A Statement from the Fleischner Society as published in Radiology 2005; 237:395-400. 2. Atherosclerosis, including left main and two-vessel coronary artery disease.   Assessment for potential risk factor modification, dietary therapy or pharmacologic therapy may be warranted, if clinically  indicated. 3.  Small amount of pericardial fluid and/or thickening.  This is unlikely to be of hemodynamic significance at this time.  No associated pericardial calcification.  CT ABDOMEN AND PELVIS  Findings:  Abdomen/Pelvis: Small gallstones layering dependently in the lumen of the gallbladder.  No signs of acute cholecystitis at this time. The appearance of the liver and bilateral adrenal glands is unremarkable.  There is a small calcification in the spleen likely represent a calcified granuloma.  In the head and the body of the pancreas there are a few punctate calcifications, suggesting sequela of mild chronic pancreatitis.  One of these calcifications, is in close proximity to the distal pancreatic duct, however, the duct is within normal caliber measuring 3 mm.  There are low attenuation lesions in the kidneys bilaterally, the largest which measures 1.2 cm extending exophytically from the lower pole of the right kidney, compatible with a simple cyst.  Other lesions are too small to definitively characterize (statistically likely represent small cyst).  Extensive atherosclerosis of the abdominal and pelvic vasculature, without definite aneurysm or dissection.  No ascites or pneumoperitoneum and no pathologic distension of bowel.  No definite pathologic lymphadenopathy identified within the abdomen or pelvis.  The appendix is normal.  There is a focal area of potential colonic wall thickening in the sigmoid colon (best demonstrated on image 115 of series 2).  Numerous colonic diverticula are noted without surrounding inflammatory changes to suggest an acute diverticulitis at this time.  Small right inguinal hernia containing only fat is incidentally noted.  Musculoskeletal: There are no aggressive appearing lytic or blastic lesions noted in the visualized portions of the skeleton.  There is a mild compression fracture of a L1 with approximately 20% loss of anterior vertebral body height (likely remote).   IMPRESSION:  1.  Focal area of potential colonic wall thickening in the mid sigmoid colon.  This could simply represent an area of scarring from prior diverticulitis (no current evidence of acute diverticulitis despite the presence of diverticular disease), however, the possibility of a colonic wall neoplasm is not excluded.  Further evaluation with sigmoidoscopy or colonoscopy may be warranted if clinically indicated. 2.  Multiple small calcifications in the pancreas, as above, favored to represent sequelae of chronic  pancreatitis. 3.  Cholelithiasis without evidence to suggest acute cholecystitis. 4.  Low attenuation lesions in the kidneys bilaterally.  The largest one on the right as compatible with a simple cyst.  Lesions in the left kidney are too small to definitively characterize, but statistically favored to represent a small cyst. 5.  Additional incidental findings, as above.  Original Report Authenticated By: Florencia Reasons, M.D.   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  CT CHEST, ABDOMEN AND PELVIS WITH CONTRAST  Technique:  Multidetector CT imaging of the chest, abdomen and pelvis was performed following the standard protocol during bolus administration of intravenous contrast.  Contrast: OMNIPAQUE IOHEXOL 300 MG/ML  SOLN  Comparison:  No priors.  CT CHEST  Findings:  Mediastinum: Heart size is normal. Small amount of pericardial fluid and/or thickening.  No associated pericardial calcification. Mild ectasia of the descending thoracic aorta (3.9 cm in diameter). There is atherosclerosis of the thoracic aorta, the great vessels of the mediastinum and the coronary arteries, including calcified atherosclerotic plaque in the left main, left anterior descending and right coronary arteries. No pathologically enlarged mediastinal or hilar lymph nodes. Esophagus is unremarkable in appearance. Separate origin of the left vertebral  artery directly off the aortic arch is incidentally noted (a normal anatomical variant).  Lungs/Pleura: There are multiple small pulmonary nodules in the lungs bilaterally, the largest of which is 7 mm in diameter in the apex of the right upper lobe (image 16 of series 5).  The largest nodule in the left lung is a 5 mm nodule in the left upper lobe (image 26 of series 5).  No acute consolidative airspace disease. No pleural effusions.  Musculoskeletal: There are no aggressive appearing lytic or blastic lesions noted in the visualized portions of the skeleton.  IMPRESSION:  1.  Multiple small pulmonary nodules in the lungs bilaterally, largest of which measures 7 mm in diameter in the apex of the right upper lobe. If the patient is at high risk for bronchogenic carcinoma, follow-up chest CT at 3-6 months is recommended.  If the patient is at low risk for bronchogenic carcinoma, follow-up chest CT at 6-12 months is recommended.  This recommendation follows the consensus statement: Guidelines for Management of Small Pulmonary Nodules Detected on CT Scans: A Statement from the Fleischner Society as published in Radiology 2005; 237:395-400. 2. Atherosclerosis, including left main and two-vessel coronary artery disease.   Assessment for potential risk factor modification, dietary therapy or pharmacologic therapy may be warranted, if clinically indicated. 3.  Small amount of pericardial fluid and/or thickening.  This is unlikely to be of hemodynamic significance at this time.  No associated pericardial calcification.  CT ABDOMEN AND PELVIS  Findings:  Abdomen/Pelvis: Small gallstones layering dependently in the lumen of the gallbladder.  No signs of acute cholecystitis at this time. The appearance of the liver and bilateral adrenal glands is unremarkable.  There is a small calcification in the spleen likely represent a calcified granuloma.  In the head and the body of the pancreas there are a few punctate calcifications,  suggesting sequela of mild chronic pancreatitis.  One of these calcifications, is in close proximity to the distal pancreatic duct, however, the duct is within normal caliber measuring 3 mm.  There are low attenuation lesions in the kidneys bilaterally, the largest which measures 1.2 cm extending exophytically from the lower pole of the right kidney, compatible with a simple  cyst.  Other lesions are too small to definitively characterize (statistically likely represent small cyst).  Extensive atherosclerosis of the abdominal and pelvic vasculature, without definite aneurysm or dissection.  No ascites or pneumoperitoneum and no pathologic distension of bowel.  No definite pathologic lymphadenopathy identified within the abdomen or pelvis.  The appendix is normal.  There is a focal area of potential colonic wall thickening in the sigmoid colon (best demonstrated on image 115 of series 2).  Numerous colonic diverticula are noted without surrounding inflammatory changes to suggest an acute diverticulitis at this time.  Small right inguinal hernia containing only fat is incidentally noted.  Musculoskeletal: There are no aggressive appearing lytic or blastic lesions noted in the visualized portions of the skeleton.  There is a mild compression fracture of a L1 with approximately 20% loss of anterior vertebral body height (likely remote).  IMPRESSION:  1.  Focal area of potential colonic wall thickening in the mid sigmoid colon.  This could simply represent an area of scarring from prior diverticulitis (no current evidence of acute diverticulitis despite the presence of diverticular disease), however, the possibility of a colonic wall neoplasm is not excluded.  Further evaluation with sigmoidoscopy or colonoscopy may be warranted if clinically indicated. 2.  Multiple small calcifications in the pancreas, as above, favored to represent sequelae of chronic pancreatitis. 3.  Cholelithiasis without evidence to suggest acute  cholecystitis. 4.  Low attenuation lesions in the kidneys bilaterally.  The largest one on the right as compatible with a simple cyst.  Lesions in the left kidney are too small to definitively characterize, but statistically favored to represent a small cyst. 5.  Additional incidental findings, as above.  Original Report Authenticated By: Florencia Reasons, M.D.    Medications: I have reviewed the patient's current medications.  Assessment and plan:  Polysubstance abuse (01/16/2012) and Suicidal attempt -Psych on board will f/u with their recommendation. -Will need placement at discharge -Continue sitter for now.  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  - Continue fluid restriction. - Continue sodium tablets and also Samsca as prescribed and recommended by nephrology. -Sodium 123 today. Will follow nephrology rec's   I appreciate Nephrology's help with this case.  HYPERTENSION, BENIGN ESSENTIAL (11/19/2007) -Not well controlled. Will add norvasc -Also PRN hydralazine for SBP > 185 or DBP > 110.  Dementia -Continue Namenda  Protein calorie malnutrition and Hypoalbuminemia -start ensure and encourage PO intake.  Tobacco abuse -Continue nicotine patch  Lung multiple lymph nodes: -Will need CT chest in 3-6 months.  Diverticulosis and Colonic wall thickening -will need colonoscopy in near future for screening purposes.  DVT: lovenox.       LOS: 6 days   Scottlynn Lindell M.D.  [facility] 01/20/2012, 11:55 AM

## 2012-01-21 DIAGNOSIS — F0390 Unspecified dementia without behavioral disturbance: Secondary | ICD-10-CM

## 2012-01-21 DIAGNOSIS — F1910 Other psychoactive substance abuse, uncomplicated: Secondary | ICD-10-CM

## 2012-01-21 DIAGNOSIS — F17200 Nicotine dependence, unspecified, uncomplicated: Secondary | ICD-10-CM

## 2012-01-21 DIAGNOSIS — T50901A Poisoning by unspecified drugs, medicaments and biological substances, accidental (unintentional), initial encounter: Secondary | ICD-10-CM

## 2012-01-21 DIAGNOSIS — R918 Other nonspecific abnormal finding of lung field: Secondary | ICD-10-CM

## 2012-01-21 DIAGNOSIS — F1020 Alcohol dependence, uncomplicated: Secondary | ICD-10-CM

## 2012-01-21 DIAGNOSIS — F1010 Alcohol abuse, uncomplicated: Secondary | ICD-10-CM

## 2012-01-21 DIAGNOSIS — E871 Hypo-osmolality and hyponatremia: Secondary | ICD-10-CM

## 2012-01-21 LAB — BASIC METABOLIC PANEL
BUN: 6 mg/dL (ref 6–23)
BUN: 7 mg/dL (ref 6–23)
Calcium: 9.8 mg/dL (ref 8.4–10.5)
Calcium: 9.7 mg/dL (ref 8.4–10.5)
Calcium: 9.8 mg/dL (ref 8.4–10.5)
Creatinine, Ser: 0.74 mg/dL (ref 0.50–1.35)
Creatinine, Ser: 0.72 mg/dL (ref 0.50–1.35)
Creatinine, Ser: 0.65 mg/dL (ref 0.50–1.35)
GFR calc Af Amer: >90 mL/min (ref >90)
GFR calc Af Amer: >90 mL/min (ref >90)
GFR calc Af Amer: >90 mL/min (ref >90)
GFR calc Af Amer: >90 mL/min (ref >90)
GFR calc non Af Amer: >90 mL/min (ref >90)
GFR calc non Af Amer: >90 mL/min (ref >90)
GFR calc non Af Amer: >90 mL/min (ref >90)
Glucose, Bld: 89 mg/dL (ref 70–99)
Potassium: 4.7 mEq/L (ref 3.5–5.1)
Sodium: 129 mEq/L — ABNORMAL LOW (ref 135–145)
Sodium: 131 mEq/L — ABNORMAL LOW (ref 135–145)

## 2012-01-21 LAB — GLUCOSE, CAPILLARY: Glucose-Capillary: 93 mg/dL (ref 70–99)

## 2012-01-21 LAB — AMMONIA: Ammonia: 33 umol/L (ref 11–60)

## 2012-01-21 NOTE — Progress Notes (Signed)
Subjective: Patient has no acute complaints today.  Reportedly has been eating more lately.  No acute issues overnight reported.  Objective: Filed Vitals:   01/20/12 2100 01/21/12 0500 01/21/12 1403 01/21/12 1414  BP: 137/78 154/94 93/61   Pulse: 91 94 97   Temp: 98.1 F (36.7 C) 99 F (37.2 C)  98.2 F (36.8 C)  TempSrc: Oral Oral    Resp: 20 20 16    Height:      Weight:      SpO2: 98% 98% 97%    Weight change:   Intake/Output Summary (Last 24 hours) at 01/21/12 1423 Last data filed at 01/21/12 1300  Gross per 24 hour  Intake    480 ml  Output   2900 ml  Net  -2420 ml    General: Alert, awake, in no acute distress.  HEENT: No bruits, no goiter.  Heart: Regular rate and rhythm, without murmurs, rubs, gallops.  Lungs: Clear to auscultation, bilateral air movement.  Abdomen: Soft, nontender, nondistended, positive bowel sounds.  Neuro: Patient responds to questions appropriately, moves all extremities. Psych:  Flat affect  Lab Results:  Basename 01/21/12 1013 01/21/12 0615 01/20/12 0619 01/19/12 2055  NA 131* 133* -- --  K 4.3 4.7 -- --  CL 91* 94* -- --  CO2 25 26 -- --  GLUCOSE 103* 92 -- --  BUN 8 7 -- --  CREATININE 0.72 0.74 -- --  CALCIUM 9.7 9.8 -- --  MG -- -- -- --  PHOS -- -- 3.7 3.6    Basename 01/20/12 0619 01/19/12 2055  AST -- --  ALT -- --  ALKPHOS -- --  BILITOT -- --  PROT -- --  ALBUMIN 3.5 3.4*   No results found for this basename: LIPASE:2,AMYLASE:2 in the last 72 hours No results found for this basename: WBC:2,NEUTROABS:2,HGB:2,HCT:2,MCV:2,PLT:2 in the last 72 hours No results found for this basename: CKTOTAL:3,CKMB:3,CKMBINDEX:3,TROPONINI:3 in the last 72 hours No components found with this basename: POCBNP:3 No results found for this basename: DDIMER:2 in the last 72 hours No results found for this basename: HGBA1C:2 in the last 72 hours No results found for this basename: CHOL:2,HDL:2,LDLCALC:2,TRIG:2,CHOLHDL:2,LDLDIRECT:2 in the  last 72 hours No results found for this basename: TSH,T4TOTAL,FREET3,T3FREE,THYROIDAB in the last 72 hours  Basename 01/19/12 1825  VITAMINB12 >2000*  FOLATE --  FERRITIN --  TIBC --  IRON --  RETICCTPCT --    Micro Results: Recent Results (from the past 240 hour(s))  MRSA PCR SCREENING     Status: Normal   Collection Time   01/15/12  2:31 AM      Component Value Range Status Comment   MRSA by PCR NEGATIVE  NEGATIVE Final   CULTURE, BLOOD (ROUTINE X 2)     Status: Normal (Preliminary result)   Collection Time   01/16/12 11:04 PM      Component Value Range Status Comment   Specimen Description BLOOD RIGHT ANTECUBITAL   Final    Special Requests BOTTLES DRAWN AEROBIC AND ANAEROBIC 10CC   Final    Culture  Setup Time [id number]   Final    Culture     Final    Value:        BLOOD CULTURE RECEIVED NO GROWTH TO DATE CULTURE WILL BE HELD FOR 5 DAYS BEFORE ISSUING A FINAL NEGATIVE REPORT   Report Status PENDING   Incomplete   CULTURE, BLOOD (ROUTINE X 2)     Status: Normal (Preliminary result)   Collection Time  01/16/12 11:13 PM      Component Value Range Status Comment   Specimen Description BLOOD LEFT HAND   Final    Special Requests BOTTLES DRAWN AEROBIC AND ANAEROBIC 4CC   Final    Culture  Setup Time [id number]   Final    Culture     Final    Value:        BLOOD CULTURE RECEIVED NO GROWTH TO DATE CULTURE WILL BE HELD FOR 5 DAYS BEFORE ISSUING A FINAL NEGATIVE REPORT   Report Status PENDING   Incomplete   URINE CULTURE     Status: Normal   Collection Time   01/17/12  1:35 PM      Component Value Range Status Comment   Specimen Description URINE, CLEAN CATCH   Final    Special Requests Normal   Final    Culture  Setup Time [id number]   Final    Colony Count 30,000 COLONIES/ML   Final    Culture     Final    Value: Multiple bacterial morphotypes present, none predominant. Suggest appropriate recollection if clinically indicated.   Report Status 01/18/2012 FINAL   Final      Studies/Results: Ct Head Wo Contrast  01/19/2012  *RADIOLOGY REPORT*  Clinical Data: Altered mental status.  Drooling.  Hyponatremia.  CT HEAD WITHOUT CONTRAST  Technique:  Contiguous axial images were obtained from the base of the skull through the vertex without contrast.  Comparison: 04/06/2007  Findings: Lacunar infarct or non-dilated perivascular space noted in the left lentiform nucleus; this is well defined and appears chronic.  Otherwise, the brain stem, cerebellum, cerebral peduncles, thalami, basal ganglia, basilar cisterns, and ventricular system appear unremarkable.  Periventricular and corona radiata white matter hypodensities are most compatible with chronic ischemic microvascular white matter disease.  No intracranial hemorrhage, mass lesion, or acute infarction is identified.  Large plugs of cerumen occlude the external auditory canals.  These could significantly affect auditory acuity.  IMPRESSION:  1.  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 2.  Periventricular and corona radiata white matter hypodensities are most compatible with chronic ischemic microvascular white matter disease. 3.  No acute intracranial findings 4.  Large plugs of cerumen in the external auditory canals could significantly degrade auditory acuity.  Original Report Authenticated By: Dellia Cloud, M.D.   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  CT CHEST, ABDOMEN AND PELVIS WITH CONTRAST  Technique:  Multidetector CT imaging of the chest, abdomen and pelvis was performed following the standard protocol during bolus administration of intravenous contrast.  Contrast: OMNIPAQUE IOHEXOL 300 MG/ML  SOLN  Comparison:  No priors.  CT CHEST  Findings:  Mediastinum: Heart size is normal. Small amount of pericardial fluid and/or thickening.  No associated pericardial calcification. Mild ectasia of  the descending thoracic aorta (3.9 cm in diameter). There is atherosclerosis of the thoracic aorta, the great vessels of the mediastinum and the coronary arteries, including calcified atherosclerotic plaque in the left main, left anterior descending and right coronary arteries. No pathologically enlarged mediastinal or hilar lymph nodes. Esophagus is unremarkable in appearance. Separate origin of the left vertebral artery directly off the aortic arch is incidentally noted (a normal anatomical variant).  Lungs/Pleura: There are multiple small pulmonary nodules in the lungs bilaterally, the largest of which is 7 mm in diameter in the apex of the right upper lobe (image  16 of series 5).  The largest nodule in the left lung is a 5 mm nodule in the left upper lobe (image 26 of series 5).  No acute consolidative airspace disease. No pleural effusions.  Musculoskeletal: There are no aggressive appearing lytic or blastic lesions noted in the visualized portions of the skeleton.  IMPRESSION:  1.  Multiple small pulmonary nodules in the lungs bilaterally, largest of which measures 7 mm in diameter in the apex of the right upper lobe. If the patient is at high risk for bronchogenic carcinoma, follow-up chest CT at 3-6 months is recommended.  If the patient is at low risk for bronchogenic carcinoma, follow-up chest CT at 6-12 months is recommended.  This recommendation follows the consensus statement: Guidelines for Management of Small Pulmonary Nodules Detected on CT Scans: A Statement from the Fleischner Society as published in Radiology 2005; 237:395-400. 2. Atherosclerosis, including left main and two-vessel coronary artery disease.   Assessment for potential risk factor modification, dietary therapy or pharmacologic therapy may be warranted, if clinically indicated. 3.  Small amount of pericardial fluid and/or thickening.  This is unlikely to be of hemodynamic significance at this time.  No associated pericardial  calcification.  CT ABDOMEN AND PELVIS  Findings:  Abdomen/Pelvis: Small gallstones layering dependently in the lumen of the gallbladder.  No signs of acute cholecystitis at this time. The appearance of the liver and bilateral adrenal glands is unremarkable.  There is a small calcification in the spleen likely represent a calcified granuloma.  In the head and the body of the pancreas there are a few punctate calcifications, suggesting sequela of mild chronic pancreatitis.  One of these calcifications, is in close proximity to the distal pancreatic duct, however, the duct is within normal caliber measuring 3 mm.  There are low attenuation lesions in the kidneys bilaterally, the largest which measures 1.2 cm extending exophytically from the lower pole of the right kidney, compatible with a simple cyst.  Other lesions are too small to definitively characterize (statistically likely represent small cyst).  Extensive atherosclerosis of the abdominal and pelvic vasculature, without definite aneurysm or dissection.  No ascites or pneumoperitoneum and no pathologic distension of bowel.  No definite pathologic lymphadenopathy identified within the abdomen or pelvis.  The appendix is normal.  There is a focal area of potential colonic wall thickening in the sigmoid colon (best demonstrated on image 115 of series 2).  Numerous colonic diverticula are noted without surrounding inflammatory changes to suggest an acute diverticulitis at this time.  Small right inguinal hernia containing only fat is incidentally noted.  Musculoskeletal: There are no aggressive appearing lytic or blastic lesions noted in the visualized portions of the skeleton.  There is a mild compression fracture of a L1 with approximately 20% loss of anterior vertebral body height (likely remote).  IMPRESSION:  1.  Focal area of potential colonic wall thickening in the mid sigmoid colon.  This could simply represent an area of scarring from prior diverticulitis  (no current evidence of acute diverticulitis despite the presence of diverticular disease), however, the possibility of a colonic wall neoplasm is not excluded.  Further evaluation with sigmoidoscopy or colonoscopy may be warranted if clinically indicated. 2.  Multiple small calcifications in the pancreas, as above, favored to represent sequelae of chronic pancreatitis. 3.  Cholelithiasis without evidence to suggest acute cholecystitis. 4.  Low attenuation lesions in the kidneys bilaterally.  The largest one on the right as compatible with a simple cyst.  Lesions  in the left kidney are too small to definitively characterize, but statistically favored to represent a small cyst. 5.  Additional incidental findings, as above.  Original Report Authenticated By: Florencia Reasons, M.D.   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  CT CHEST, ABDOMEN AND PELVIS WITH CONTRAST  Technique:  Multidetector CT imaging of the chest, abdomen and pelvis was performed following the standard protocol during bolus administration of intravenous contrast.  Contrast: OMNIPAQUE IOHEXOL 300 MG/ML  SOLN  Comparison:  No priors.  CT CHEST  Findings:  Mediastinum: Heart size is normal. Small amount of pericardial fluid and/or thickening.  No associated pericardial calcification. Mild ectasia of the descending thoracic aorta (3.9 cm in diameter). There is atherosclerosis of the thoracic aorta, the great vessels of the mediastinum and the coronary arteries, including calcified atherosclerotic plaque in the left main, left anterior descending and right coronary arteries. No pathologically enlarged mediastinal or hilar lymph nodes. Esophagus is unremarkable in appearance. Separate origin of the left vertebral artery directly off the aortic arch is incidentally noted (a normal anatomical variant).  Lungs/Pleura: There are multiple small pulmonary nodules in the lungs  bilaterally, the largest of which is 7 mm in diameter in the apex of the right upper lobe (image 16 of series 5).  The largest nodule in the left lung is a 5 mm nodule in the left upper lobe (image 26 of series 5).  No acute consolidative airspace disease. No pleural effusions.  Musculoskeletal: There are no aggressive appearing lytic or blastic lesions noted in the visualized portions of the skeleton.  IMPRESSION:  1.  Multiple small pulmonary nodules in the lungs bilaterally, largest of which measures 7 mm in diameter in the apex of the right upper lobe. If the patient is at high risk for bronchogenic carcinoma, follow-up chest CT at 3-6 months is recommended.  If the patient is at low risk for bronchogenic carcinoma, follow-up chest CT at 6-12 months is recommended.  This recommendation follows the consensus statement: Guidelines for Management of Small Pulmonary Nodules Detected on CT Scans: A Statement from the Fleischner Society as published in Radiology 2005; 237:395-400. 2. Atherosclerosis, including left main and two-vessel coronary artery disease.   Assessment for potential risk factor modification, dietary therapy or pharmacologic therapy may be warranted, if clinically indicated. 3.  Small amount of pericardial fluid and/or thickening.  This is unlikely to be of hemodynamic significance at this time.  No associated pericardial calcification.  CT ABDOMEN AND PELVIS  Findings:  Abdomen/Pelvis: Small gallstones layering dependently in the lumen of the gallbladder.  No signs of acute cholecystitis at this time. The appearance of the liver and bilateral adrenal glands is unremarkable.  There is a small calcification in the spleen likely represent a calcified granuloma.  In the head and the body of the pancreas there are a few punctate calcifications, suggesting sequela of mild chronic pancreatitis.  One of these calcifications, is in close proximity to the distal pancreatic duct, however, the duct is within  normal caliber measuring 3 mm.  There are low attenuation lesions in the kidneys bilaterally, the largest which measures 1.2 cm extending exophytically from the lower pole of the right kidney, compatible with a simple cyst.  Other lesions are too small to definitively characterize (statistically likely represent small cyst).  Extensive atherosclerosis of the abdominal and pelvic vasculature, without definite aneurysm or dissection.  No ascites or pneumoperitoneum  and no pathologic distension of bowel.  No definite pathologic lymphadenopathy identified within the abdomen or pelvis.  The appendix is normal.  There is a focal area of potential colonic wall thickening in the sigmoid colon (best demonstrated on image 115 of series 2).  Numerous colonic diverticula are noted without surrounding inflammatory changes to suggest an acute diverticulitis at this time.  Small right inguinal hernia containing only fat is incidentally noted.  Musculoskeletal: There are no aggressive appearing lytic or blastic lesions noted in the visualized portions of the skeleton.  There is a mild compression fracture of a L1 with approximately 20% loss of anterior vertebral body height (likely remote).  IMPRESSION:  1.  Focal area of potential colonic wall thickening in the mid sigmoid colon.  This could simply represent an area of scarring from prior diverticulitis (no current evidence of acute diverticulitis despite the presence of diverticular disease), however, the possibility of a colonic wall neoplasm is not excluded.  Further evaluation with sigmoidoscopy or colonoscopy may be warranted if clinically indicated. 2.  Multiple small calcifications in the pancreas, as above, favored to represent sequelae of chronic pancreatitis. 3.  Cholelithiasis without evidence to suggest acute cholecystitis. 4.  Low attenuation lesions in the kidneys bilaterally.  The largest one on the right as compatible with a simple cyst.  Lesions in the left kidney  are too small to definitively characterize, but statistically favored to represent a small cyst. 5.  Additional incidental findings, as above.  Original Report Authenticated By: Florencia Reasons, M.D.    Medications: I have reviewed the patient's current medications.  Assessment and plan:   Polysubstance abuse (01/16/2012) and Suicidal attempt  -Psych on board will f/u with their recommendation.  -Will need placement at discharge  -Continue sitter for now.  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  -Continue fluid restriction.  -Continue sodium tablets, Currently improved after Samsca administration which has been discontinued. - Will follow nephrology rec's.   I appreciate Nephrology's help with this case.  - CT scans were obtained which showed pulmonary nodules as well as colonic wall thickening in the mid sigmoid colon. Patient will need colonoscopy.  HYPERTENSION, BENIGN ESSENTIAL (11/19/2007) -Not well controlled. Will add norvasc  -Also PRN hydralazine for SBP > 185 or DBP > 110.   Dementia  -Continue Namenda   Protein calorie malnutrition and Hypoalbuminemia  -start ensure and encourage PO intake.   Tobacco abuse  -Continue nicotine patch   Lung multiple lymph nodes:  - CT scans were obtained which showed pulmonary nodules and as such we have consulted pulmonology for further recommendations.   -Will need CT chest in 3-6 months.   Diverticulosis and Colonic wall thickening  -will need colonoscopy in near future for screening purposes.   DVT: lovenox.    LOS: 7 days   Penny Pia M.D.  [facility] 01/21/2012, 2:23 PM

## 2012-01-21 NOTE — Progress Notes (Signed)
Patient ID: Edgar Vazquez, male   DOB: [birth year], 66 y.o.   MRN: [id number] S:no complaints O:BP 154/94  Pulse 94  Temp 99 F (37.2 C) (Oral)  Resp 20  Ht 5\' 11"  (1.803 m)  Wt 74.98 kg (165 lb 4.8 oz)  BMI 23.05 kg/m2  SpO2 98%  Intake/Output Summary (Last 24 hours) at 01/21/12 0846 Last data filed at 01/20/12 2000  Gross per 24 hour  Intake    480 ml  Output   2900 ml  Net  -2420 ml   Weight change:  Gen:WD WM in NAD CVS:RRR Resp:occ rhonchi BJY:NWGNFA Ext:no edema   Lab 01/21/12 0615 01/21/12 0215 01/20/12 2216 01/20/12 1814 01/20/12 1346 01/20/12 1033 01/20/12 0622 01/20/12 0619 01/19/12 2055 01/19/12 1825 01/15/12 0345 01/14/12 2225  NA 133* 129* 131* 130* 123* 123* 121* -- -- -- -- --  K 4.7 4.2 4.5 4.5 5.3* 4.6 4.9 -- -- -- -- --  CL 94* 91* 92* 92* 88* 88* 86* -- -- -- -- --  CO2 26 23 25 24 22 22 22  -- -- -- -- --  GLUCOSE 92 89 87 119* 108* 87 88 -- -- -- -- --  BUN 7 6 7 6  5* 5* 4* -- -- -- -- --  CREATININE 0.74 0.62 0.74 0.73 0.61 0.66 0.66 -- -- -- -- --  ALBUMIN -- -- -- -- -- -- -- 3.5 3.4* 3.6 3.1* 3.7  CALCIUM 9.8 9.8 10.0 10.0 9.2 8.8 9.3 -- -- -- -- --  PHOS -- -- -- -- -- -- -- 3.7 3.6 2.7 3.0 --  AST -- -- -- -- -- -- -- -- -- -- 13 15  ALT -- -- -- -- -- -- -- -- -- -- 10 12   Liver Function Tests:  Lab 01/20/12 0619 01/19/12 2055 01/19/12 1825 01/15/12 0345 01/14/12 2225  AST -- -- -- 13 15  ALT -- -- -- 10 12  ALKPHOS -- -- -- 81 95  BILITOT -- -- -- 0.3 0.5  PROT -- -- -- 6.1 7.4  ALBUMIN 3.5 3.4* 3.6 -- --   No results found for this basename: LIPASE:3,AMYLASE:3 in the last 168 hours No results found for this basename: AMMONIA:3 in the last 168 hours CBC:  Lab 01/16/12 2313 01/15/12 0345 01/14/12 2225  WBC 8.3 5.7 6.4  NEUTROABS 6.3 3.7 --  HGB 12.8* 13.0 14.3  HCT 37.6* 40.0 42.2  MCV 89.7 93.2 91.7  PLT 225 234 244   Cardiac Enzymes:  Lab 01/15/12 1840 01/15/12 1040 01/15/12 0345  CKTOTAL 146 136 130  CKMB 5.9* 5.5* 5.0*    CKMBINDEX -- -- --  TROPONINI <0.30 <0.30 <0.30   CBG:  Lab 01/21/12 0734 01/19/12 0833 01/19/12 0751 01/18/12 0731 01/17/12 0815  GLUCAP 93 134* 53* 97 93    Iron Studies: No results found for this basename: IRON,TIBC,TRANSFERRIN,FERRITIN in the last 72 hours Studies/Results: Dg Chest 2 View  01/19/2012  *RADIOLOGY REPORT*  Clinical Data: Abdominal prior chest x-ray  CHEST - 2 VIEW  Comparison: 01/16/2012  Findings: Cardiomediastinal silhouette is stable.  No acute infiltrate or pulmonary edema.  Mild hyperinflation.  Mild left basilar atelectasis.  Nodular density projecting bilateral lower lobe may represent nipple shadow.  Repeat frontal view with nipple markers is recommended for confirmation.  Bony thorax is unremarkable.  IMPRESSION: No acute infiltrate or pulmonary edema.  Mild hyperinflation.  Mild left basilar atelectasis.  Nodular density projecting bilateral lower lobe may represent nipple shadow.  Repeat frontal view with nipple markers is recommended for confirmation.  Original Report Authenticated By: Natasha Mead, M.D.   Ct Head Wo Contrast  01/19/2012  *RADIOLOGY REPORT*  Clinical Data: Altered mental status.  Drooling.  Hyponatremia.  CT HEAD WITHOUT CONTRAST  Technique:  Contiguous axial images were obtained from the base of the skull through the vertex without contrast.  Comparison: 04/06/2007  Findings: Lacunar infarct or non-dilated perivascular space noted in the left lentiform nucleus; this is well defined and appears chronic.  Otherwise, the brain stem, cerebellum, cerebral peduncles, thalami, basal ganglia, basilar cisterns, and ventricular system appear unremarkable.  Periventricular and corona radiata white matter hypodensities are most compatible with chronic ischemic microvascular white matter disease.  No intracranial hemorrhage, mass lesion, or acute infarction is identified.  Large plugs of cerumen occlude the external auditory canals.  These could significantly affect  auditory acuity.  IMPRESSION:  1.  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 2.  Periventricular and corona radiata white matter hypodensities are most compatible with chronic ischemic microvascular white matter disease. 3.  No acute intracranial findings 4.  Large plugs of cerumen in the external auditory canals could significantly degrade auditory acuity.  Original Report Authenticated By: Dellia Cloud, M.D.   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  CT CHEST, ABDOMEN AND PELVIS WITH CONTRAST  Technique:  Multidetector CT imaging of the chest, abdomen and pelvis was performed following the standard protocol during bolus administration of intravenous contrast.  Contrast: OMNIPAQUE IOHEXOL 300 MG/ML  SOLN  Comparison:  No priors.  CT CHEST  Findings:  Mediastinum: Heart size is normal. Small amount of pericardial fluid and/or thickening.  No associated pericardial calcification. Mild ectasia of the descending thoracic aorta (3.9 cm in diameter). There is atherosclerosis of the thoracic aorta, the great vessels of the mediastinum and the coronary arteries, including calcified atherosclerotic plaque in the left main, left anterior descending and right coronary arteries. No pathologically enlarged mediastinal or hilar lymph nodes. Esophagus is unremarkable in appearance. Separate origin of the left vertebral artery directly off the aortic arch is incidentally noted (a normal anatomical variant).  Lungs/Pleura: There are multiple small pulmonary nodules in the lungs bilaterally, the largest of which is 7 mm in diameter in the apex of the right upper lobe (image 16 of series 5).  The largest nodule in the left lung is a 5 mm nodule in the left upper lobe (image 26 of series 5).  No acute consolidative airspace disease. No pleural effusions.  Musculoskeletal: There are no aggressive  appearing lytic or blastic lesions noted in the visualized portions of the skeleton.  IMPRESSION:  1.  Multiple small pulmonary nodules in the lungs bilaterally, largest of which measures 7 mm in diameter in the apex of the right upper lobe. If the patient is at high risk for bronchogenic carcinoma, follow-up chest CT at 3-6 months is recommended.  If the patient is at low risk for bronchogenic carcinoma, follow-up chest CT at 6-12 months is recommended.  This recommendation follows the consensus statement: Guidelines for Management of Small Pulmonary Nodules Detected on CT Scans: A Statement from the Fleischner Society as published in Radiology 2005; 237:395-400. 2. Atherosclerosis, including left main and two-vessel coronary artery disease.   Assessment for potential risk factor modification, dietary therapy or pharmacologic therapy may be warranted, if clinically indicated. 3.  Small amount of  pericardial fluid and/or thickening.  This is unlikely to be of hemodynamic significance at this time.  No associated pericardial calcification.  CT ABDOMEN AND PELVIS  Findings:  Abdomen/Pelvis: Small gallstones layering dependently in the lumen of the gallbladder.  No signs of acute cholecystitis at this time. The appearance of the liver and bilateral adrenal glands is unremarkable.  There is a small calcification in the spleen likely represent a calcified granuloma.  In the head and the body of the pancreas there are a few punctate calcifications, suggesting sequela of mild chronic pancreatitis.  One of these calcifications, is in close proximity to the distal pancreatic duct, however, the duct is within normal caliber measuring 3 mm.  There are low attenuation lesions in the kidneys bilaterally, the largest which measures 1.2 cm extending exophytically from the lower pole of the right kidney, compatible with a simple cyst.  Other lesions are too small to definitively characterize (statistically likely represent small  cyst).  Extensive atherosclerosis of the abdominal and pelvic vasculature, without definite aneurysm or dissection.  No ascites or pneumoperitoneum and no pathologic distension of bowel.  No definite pathologic lymphadenopathy identified within the abdomen or pelvis.  The appendix is normal.  There is a focal area of potential colonic wall thickening in the sigmoid colon (best demonstrated on image 115 of series 2).  Numerous colonic diverticula are noted without surrounding inflammatory changes to suggest an acute diverticulitis at this time.  Small right inguinal hernia containing only fat is incidentally noted.  Musculoskeletal: There are no aggressive appearing lytic or blastic lesions noted in the visualized portions of the skeleton.  There is a mild compression fracture of a L1 with approximately 20% loss of anterior vertebral body height (likely remote).  IMPRESSION:  1.  Focal area of potential colonic wall thickening in the mid sigmoid colon.  This could simply represent an area of scarring from prior diverticulitis (no current evidence of acute diverticulitis despite the presence of diverticular disease), however, the possibility of a colonic wall neoplasm is not excluded.  Further evaluation with sigmoidoscopy or colonoscopy may be warranted if clinically indicated. 2.  Multiple small calcifications in the pancreas, as above, favored to represent sequelae of chronic pancreatitis. 3.  Cholelithiasis without evidence to suggest acute cholecystitis. 4.  Low attenuation lesions in the kidneys bilaterally.  The largest one on the right as compatible with a simple cyst.  Lesions in the left kidney are too small to definitively characterize, but statistically favored to represent a small cyst. 5.  Additional incidental findings, as above.  Original Report Authenticated By: Florencia Reasons, M.D.   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  CT  CHEST, ABDOMEN AND PELVIS WITH CONTRAST  Technique:  Multidetector CT imaging of the chest, abdomen and pelvis was performed following the standard protocol during bolus administration of intravenous contrast.  Contrast: OMNIPAQUE IOHEXOL 300 MG/ML  SOLN  Comparison:  No priors.  CT CHEST  Findings:  Mediastinum: Heart size is normal. Small amount of pericardial fluid and/or thickening.  No associated pericardial calcification. Mild ectasia of the descending thoracic aorta (3.9 cm in diameter). There is atherosclerosis of the thoracic aorta, the great vessels of the mediastinum and the coronary arteries, including calcified atherosclerotic plaque in the left main, left anterior descending and right coronary arteries. No pathologically enlarged mediastinal or hilar lymph nodes. Esophagus is unremarkable in appearance. Separate origin of the left  vertebral artery directly off the aortic arch is incidentally noted (a normal anatomical variant).  Lungs/Pleura: There are multiple small pulmonary nodules in the lungs bilaterally, the largest of which is 7 mm in diameter in the apex of the right upper lobe (image 16 of series 5).  The largest nodule in the left lung is a 5 mm nodule in the left upper lobe (image 26 of series 5).  No acute consolidative airspace disease. No pleural effusions.  Musculoskeletal: There are no aggressive appearing lytic or blastic lesions noted in the visualized portions of the skeleton.  IMPRESSION:  1.  Multiple small pulmonary nodules in the lungs bilaterally, largest of which measures 7 mm in diameter in the apex of the right upper lobe. If the patient is at high risk for bronchogenic carcinoma, follow-up chest CT at 3-6 months is recommended.  If the patient is at low risk for bronchogenic carcinoma, follow-up chest CT at 6-12 months is recommended.  This recommendation follows the consensus statement: Guidelines for Management of Small Pulmonary Nodules Detected on CT Scans: A  Statement from the Fleischner Society as published in Radiology 2005; 237:395-400. 2. Atherosclerosis, including left main and two-vessel coronary artery disease.   Assessment for potential risk factor modification, dietary therapy or pharmacologic therapy may be warranted, if clinically indicated. 3.  Small amount of pericardial fluid and/or thickening.  This is unlikely to be of hemodynamic significance at this time.  No associated pericardial calcification.  CT ABDOMEN AND PELVIS  Findings:  Abdomen/Pelvis: Small gallstones layering dependently in the lumen of the gallbladder.  No signs of acute cholecystitis at this time. The appearance of the liver and bilateral adrenal glands is unremarkable.  There is a small calcification in the spleen likely represent a calcified granuloma.  In the head and the body of the pancreas there are a few punctate calcifications, suggesting sequela of mild chronic pancreatitis.  One of these calcifications, is in close proximity to the distal pancreatic duct, however, the duct is within normal caliber measuring 3 mm.  There are low attenuation lesions in the kidneys bilaterally, the largest which measures 1.2 cm extending exophytically from the lower pole of the right kidney, compatible with a simple cyst.  Other lesions are too small to definitively characterize (statistically likely represent small cyst).  Extensive atherosclerosis of the abdominal and pelvic vasculature, without definite aneurysm or dissection.  No ascites or pneumoperitoneum and no pathologic distension of bowel.  No definite pathologic lymphadenopathy identified within the abdomen or pelvis.  The appendix is normal.  There is a focal area of potential colonic wall thickening in the sigmoid colon (best demonstrated on image 115 of series 2).  Numerous colonic diverticula are noted without surrounding inflammatory changes to suggest an acute diverticulitis at this time.  Small right inguinal hernia containing  only fat is incidentally noted.  Musculoskeletal: There are no aggressive appearing lytic or blastic lesions noted in the visualized portions of the skeleton.  There is a mild compression fracture of a L1 with approximately 20% loss of anterior vertebral body height (likely remote).  IMPRESSION:  1.  Focal area of potential colonic wall thickening in the mid sigmoid colon.  This could simply represent an area of scarring from prior diverticulitis (no current evidence of acute diverticulitis despite the presence of diverticular disease), however, the possibility of a colonic wall neoplasm is not excluded.  Further evaluation with sigmoidoscopy or colonoscopy may be warranted if clinically indicated. 2.  Multiple small calcifications in  the pancreas, as above, favored to represent sequelae of chronic pancreatitis. 3.  Cholelithiasis without evidence to suggest acute cholecystitis. 4.  Low attenuation lesions in the kidneys bilaterally.  The largest one on the right as compatible with a simple cyst.  Lesions in the left kidney are too small to definitively characterize, but statistically favored to represent a small cyst. 5.  Additional incidental findings, as above.  Original Report Authenticated By: Florencia Reasons, M.D.      . amLODipine  5 mg Oral Daily  . cloNIDine  0.2 mg Transdermal Weekly  . enoxaparin  40 mg Subcutaneous Q24H  . feeding supplement  237 mL Oral BID BM  . folic acid  1 mg Intravenous Daily  . memantine  10 mg Oral BID  . nicotine  14 mg Transdermal Daily  . psyllium  1 packet Oral Daily  . sodium chloride  3 mL Intravenous Q12H  . sodium chloride  2 g Oral TID WC  . thiamine  100 mg Intravenous Daily  . DISCONTD: furosemide  20 mg Oral BID  . DISCONTD: tolvaptan  15 mg Oral Daily    BMET    Component Value Date/Time   NA 133* 01/21/2012 0615   K 4.7 01/21/2012 0615   CL 94* 01/21/2012 0615   CO2 26 01/21/2012 0615   GLUCOSE 92 01/21/2012 0615   BUN 7 01/21/2012 0615    CREATININE 0.74 01/21/2012 0615   CALCIUM 9.8 01/21/2012 0615   GFRNONAA >90 01/21/2012 0615   GFRAA >90 01/21/2012 0615   CBC    Component Value Date/Time   WBC 8.3 01/16/2012 2313   RBC 4.19* 01/16/2012 2313   HGB 12.8* 01/16/2012 2313   HCT 37.6* 01/16/2012 2313   PLT 225 01/16/2012 2313   MCV 89.7 01/16/2012 2313   MCH 30.5 01/16/2012 2313   MCHC 34.0 01/16/2012 2313   RDW 13.5 01/16/2012 2313   LYMPHSABS 1.1 01/16/2012 2313   MONOABS 0.8 01/16/2012 2313   EOSABS 0.0 01/16/2012 2313   BASOSABS 0.0 01/16/2012 2313   Assessment/Plan:  1. 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 Pt with multiple bilateral pulmonary nodules concerning for bronchogenic carcinoma in a smoker/drinker. Marked improvement of serum Na with tolvaptan, will discontinue tolvaptan today and resume fluid restriction. Follow Na closely and consult pulmonology to evaluate lung lesions. Also need to improve nutrition as patient is not eating and may further contribute to hyponatremia  2. AMS- ?psychiatric vs etoh encephelopathy vs metabolic encephelopathy- CT of brain showed old lacunar infarct and chronic ischemic changes. Consider Neuro evaluation 3. HTN- stop IVF's and add amlodipine 10mg  to regimen. D/c lasix 4. malnutrition- start protein supplements 5. Psych- ?suicide attempt, Psych following.  6. Disposition- will need placement in SNF when stable 7. Will sign off for now.  Will not need renal follow up as he needs a pulmonary evaluation for source of SIADH, and possibly neuro eval.  Floyce Bujak A

## 2012-01-21 NOTE — Consult Note (Signed)
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 Gender: male PCP: MARTIN,NYKEDTRA, NP   History of Present Illness: [name] is a 66 y/o man adm 6/24 after ingesting a bottle of Lisinopril tabs- ?denied suicidal ideation, ?mistake, ?accident- he is demented & is currently seeing Psychiatry while hospitalized.  The chart indicates a PMHx of smoking & alcohol abuse, and apparently some HBP; but no other hx or details are available.  He presented w/ some Hyponatremia & Hypokalemia- the potassium was replaced but the sodium got worse despite fluid restrict & some Lasix.  Urine studies confirmed SIADH & sodium dropped to 118 range before saline, then Samsca used to improve his numbers.  Further work up revealed several small pulm nodules and consult requested to aide in evaluation of these lesions...   No PMHx on file & pt unable to provide hx... He is apparently allergic to PCN   Past Surgical History  Procedure Date  . Ankle surgery     No family history on file.   History   Social History  . Marital Status: Legally Separated    Spouse Name: N/A    Number of Children: N/A  . Years of Education: N/A   Occupational History  . Not on file.   Social History Main Topics  . Smoking status: Current Everyday Smoker  . Smokeless tobacco: Not on file  . Alcohol Use: Yes     occasional  . Drug Use: No  . Sexually Active:    Other Topics Concern  . Not on file   Social History Narrative  . No narrative on file    Prior to Admission medications   Not on File     Allergies: Penicillins   Review of Systems:  Not obtainable from the patient...   Physical Exam:   Filed Vitals:   01/20/12 0614 01/20/12 1402 01/20/12  2100 01/21/12 0500  BP: 182/71 135/89 137/78 154/94  Pulse: 85 90 91 94  Temp: 97.6 F (36.4 C) 98.6 F (37 C) 98.1 F (36.7 C) 99 F (37.2 C)  TempSrc: Oral Oral Oral Oral  Resp: 21 20 20 20   Height:      Weight: 74.98 kg (165 lb 4.8 oz)     SpO2: 100% 99% 98% 98%   Gen: 66 y/o WM appears older, intermittently cooperative during exam, but won't talk... ENT: No lesions,  mouth clear,  oropharynx clear, no postnasal drip Neck: No JVD, no thyromegaly, no carotid bruits Lungs: No use of accessory muscles, no dullness to percussion, decr BS, clear without rales or rhonchi Cardiovascular: RRR, heart sounds normal, no murmur or gallops, no peripheral edema Abdomen: soft and nontender, no HSM, BS normal Musculoskeletal: No deformities, no cyanosis or clubbing Neuro: lethargic, won't talk to me, moves all 4 w/o focal abn detected Skin: Warm, no lesions seen  Labs  Lab 01/21/12 1013 01/21/12 0615 01/21/12 0215 01/20/12 2216 01/20/12 1814 01/20/12 1346 01/20/12 1033 01/20/12 0619 01/19/12 2055 01/19/12 1825 01/15/12 0345  NA 131* 133* 129* 131* 130* 123* 123* -- -- -- --  K 4.3 4.7 4.2 4.5 4.5 5.3* 4.6 -- -- -- --  CL 91* 94* 91* 92* 92* 88* 88* -- -- -- --  CO2 25 26 23 25 24 22 22  -- -- -- --  GLUCOSE 103* 92 89 87 119* 108*  87 -- -- -- --  BUN 8 7 6 7 6  5* 5* -- -- -- --  CREATININE 0.72 0.74 0.62 0.74 0.73 0.61 0.66 -- -- -- --  ALB -- -- -- -- -- -- -- -- -- -- --  CALCIUM 9.7 9.8 9.8 10.0 10.0 9.2 8.8 -- -- -- --  PHOS -- -- -- -- -- -- -- 3.7 3.6 2.7 3.0    Lab 01/16/12 2313 01/15/12 0345 01/14/12 2225  WBC 8.3 5.7 6.4  NEUTROABS 6.3 3.7 --  HGB 12.8* 13.0 14.3  HCT 37.6* 40.0 42.2  MCV 89.7 93.2 91.7  PLT 225 234 244   Liver Function Tests:  Lab 01/20/12 0619 01/19/12 2055 01/19/12 1825 01/15/12 0345 01/14/12 2225  AST -- -- -- 13 15  ALT -- -- -- 10 12  ALKPHOS -- -- -- 81 95  BILITOT -- -- -- 0.3 0.5  PROT -- -- -- 6.1 7.4  ALBUMIN 3.5 3.4* 3.6 -- --   No  results found for this basename: LIPASE:3,AMYLASE:3 in the last 168 hours No results found for this basename: AMMONIA:3 in the last 168 hours Cardiac Enzymes:  Lab 01/15/12 1840 01/15/12 1040 01/15/12 0345  CKTOTAL 146 136 130  CKMB 5.9* 5.5* 5.0*  CKMBINDEX -- -- --  TROPONINI <0.30 <0.30 <0.30   Iron Studies: No results found for this basename: IRON,TIBC,TRANSFERRIN,FERRITIN in the last 72 hours   Imaging Studies:     CXR 6/28> mild hyperinflation, mild left basilar atx, ?nipple shadows, NAD...   CTChest> IMPRESSION:  1. Multiple small pulmonary nodules in the lungs bilaterally,  largest of which measures 7 mm in diameter in the apex of the right  upper lobe. If the patient is at high risk for bronchogenic  carcinoma, follow-up chest CT at 3-6 months is recommended. If the  patient is at low risk for bronchogenic carcinoma, follow-up chest  CT at 6-12 months is recommended. This recommendation follows the  consensus statement: Guidelines for Management of Small Pulmonary  Nodules Detected on CT Scans: A Statement from the Fleischner  Society as published in Radiology 2005; 237:395-400.  2. Atherosclerosis, including left main and two-vessel coronary  artery disease. Assessment for potential risk factor  modification, dietary therapy or pharmacologic therapy may be  warranted, if clinically indicated.  3. Small amount of pericardial fluid and/or thickening. This is  unlikely to be of hemodynamic significance at this time. No  associated pericardial calcification.  CTAbd & Pelvis> IMPRESSION:  1. Focal area of potential colonic wall thickening in the mid  sigmoid colon. This could simply represent an area of scarring  from prior diverticulitis (no current evidence of acute  diverticulitis despite the presence of diverticular disease),  however, the possibility of a colonic wall neoplasm is not  excluded. Further evaluation with sigmoidoscopy or colonoscopy may  be warranted  if clinically indicated.  2. Multiple small calcifications in the pancreas, as above,  favored to represent sequelae of chronic pancreatitis.  3. Cholelithiasis without evidence to suggest acute cholecystitis.  4. Low attenuation lesions in the kidneys bilaterally. The  largest one on the right as compatible with a simple cyst. Lesions  in the left kidney are too small to definitively characterize, but  statistically favored to represent a small cyst.  5. Additional incidental findings, as above.  CTHead> IMPRESSION:  1. Chronic-appearing dilated perivascular space or chronic lacunar  infarct in the left lentiform nucleus.  2. Periventricular and corona radiata white matter hypodensities  are  most compatible with chronic ischemic microvascular white  matter disease.  3. No acute intracranial findings  4. Large plugs of cerumen in the external auditory canals could  significantly degrade auditory acuity.    Assessment & Plan:    Small bilat pulm nodules>  ~the largest lesion is a 7mm nodule in RUL & not seen on plain film; there is a 5mm lesion in the LUL ~these are likely too small to biopsy at this time, but we will check w/ IR, suspect bronch would be low yield. ~consider PET scan ~he will need follow up w/ repeat CXR & CTChest in the 3-59mo range  Cigarette smoker> doubt counseling much benefit w/ this dementia; it would be helpful to know his pre-hosp status.  HBP> off meds & monitored by [facility]...  Atherosclerotic dis> both in coronaries & peripherally on CT scans  Hyponatremia/ SIADH>  Eval by [facility] & Renal, now improved w/ IV saline & Samsca; seems unlikely to be due to these tiny pulm lesons esp without assoc adenopathy & adrenals are neg as well... As above> consider PET, doubt IR can biopsy & bronch would be low yield.  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 Dementia/ Confusion>    Bobak Oguinn M 01/21/2012

## 2012-01-21 NOTE — Plan of Care (Signed)
Problem: Phase III Progression Outcomes Goal: Activity at appropriate level-compared to baseline (UP IN CHAIR FOR HEMODIALYSIS)  Outcome: Progressing Up in chair

## 2012-01-21 NOTE — Consult Note (Signed)
Patient Identification:  Edgar Vazquez Date of Evaluation:  01/21/2012 Reason for Consult: Intentional overdose vs. overdose  Referring Provider: Dr. Gonzella Lex   History of Present Illness:Pt says the woman's son uses drugs.  He went into  the son's room and claims they accused him of taking the son's pills   Interval hx;   Seen today. Talked with his nurse. Pt is sleepy. Very poor historian. No eye contact and no effort to engage now. Pt nurse pt is stay like that most of the time. No aggressive behavior recently.   Past Psychiatric History:   unknown;  Pt is unable to provide past history  Past Medical History:e with woman, Edgar Vazquez, and her grown son.  He claims the son blames him for taking medications after pt found pills in the room     History reviewed. No pertinent past medical history.     Past Surgical History  Procedure Date  . Ankle surgery     Allergies:  Allergies  Allergen Reactions  . Penicillins     Current Medications:  Prior to Admission medications   Not on File    Social History:    reports that he has been smoking.  He does not have any smokeless tobacco history on file. He reports that he drinks alcohol. He reports that he does not use illicit drugs.   Family History:    No family history on file.  Mental Status Examination/Evaluation: Objective:  Appearance: Disheveled and appears neglected and undernourished  Psychomotor Activity:  Decreased  Eye Contact::  Fair  Speech:  very limited usully "don't know'  Volume:  Decreased  Mood:  No answer  Affect:  Blunt and Depressed  Thought Process:  disorganized  Orientation:  Other:  Not oriented to date, location, situation ; just name; knows he is in a hospital  Thought Content:  No avh  Suicidal Thoughts:  No  Homicidal Thoughts:  No  Judgement:  Poor  Insight:  Lacking    DIAGNOSIS:   AXIS I   Dementia; Alcohol Dependence r/o  benign familial tremor vs alcohol tremor  AXIS II  Deffered   AXIS III See medical notes.  AXIS IV economic problems, educational problems, housing problems, other psychosocial or environmental problems, problems related to social environment, problems with access to health care services and problems with primary support group  AXIS V 30    RECOMMENDATION:   - waiting for SNF  -1;1 is good option if pt has fall risk  - Psy will continue to follow as needed               ROS   Physical Exam

## 2012-01-22 DIAGNOSIS — I10 Essential (primary) hypertension: Secondary | ICD-10-CM

## 2012-01-22 LAB — BASIC METABOLIC PANEL
BUN: 11 mg/dL (ref 6–23)
CO2: 21 mEq/L (ref 19–32)
Calcium: 9.6 mg/dL (ref 8.4–10.5)
Chloride: 95 mEq/L — ABNORMAL LOW (ref 96–112)
Creatinine, Ser: 0.65 mg/dL (ref 0.50–1.35)
Creatinine, Ser: 0.74 mg/dL (ref 0.50–1.35)
GFR calc Af Amer: >90 mL/min (ref >90)
Glucose, Bld: 95 mg/dL (ref 70–99)

## 2012-01-22 LAB — GLUCOSE, CAPILLARY: Glucose-Capillary: 102 mg/dL — ABNORMAL HIGH (ref 70–99)

## 2012-01-22 LAB — VITAMIN B12: Vitamin B-12: 1058 pg/mL — ABNORMAL HIGH (ref 211–911)

## 2012-01-22 NOTE — Progress Notes (Signed)
Sitter has been discontinued

## 2012-01-22 NOTE — Progress Notes (Signed)
Patient has denied suicidal or homicidal ideations for the past 48 hours and indicates that it was never his intention to harm himself. Psychiatry/internal medicine please evaluate the need to continue suicide sitter. Patient has not posed any threat to himself or others.

## 2012-01-22 NOTE — Consult Note (Signed)
Patient Identification:  MARIEL GAUDIN Date of Evaluation:  01/22/2012 Reason for Consult: Intentional overdose vs. overdose  Referring Provider: Dr. Gonzella Lex   History of Present Illness:Pt says the woman's son uses drugs.  He went into  the son's room and claims they accused him of taking the son's pills   Interval hx;   Seen today.  Pt talked more but still very withdrawn. Very poor historian. No eye contact but made effort to engage today. No aggressive behavior recently. Stays on bed mostly.   Past Psychiatric History:   unknown;  Pt is unable to provide past history  Past Medical History:e with woman, [name], and her grown son.  He claims the son blames him for taking medications after pt found pills in the room     History reviewed. No pertinent past medical history.     Past Surgical History  Procedure Date  . Ankle surgery     Allergies:  Allergies  Allergen Reactions  . Penicillins     Current Medications:  Prior to Admission medications   Not on File    Social History:    reports that he has been smoking.  He does not have any smokeless tobacco history on file. He reports that he drinks alcohol. He reports that he does not use illicit drugs.   Family History:    No family history on file.  Mental Status Examination/Evaluation: Objective:  Appearance: Disheveled and appears neglected and undernourished  Psychomotor Activity:  Decreased  Eye Contact::  Fair  Speech:  very limited usully "don't know'  Volume:  Decreased  Mood:  No answer  Affect:  Blunt and Depressed  Thought Process:  disorganized  Orientation:  Other:  Not oriented to date, location, situation ; just name; knows he is in a hospital  Thought Content:  No avh  Suicidal Thoughts:  No  Homicidal Thoughts:  No  Judgement:  Poor  Insight:  Lacking    DIAGNOSIS:   AXIS I   Dementia; Alcohol Dependence r/o  benign familial tremor vs alcohol tremor  AXIS II  Deffered  AXIS III See  medical notes.  AXIS IV economic problems, educational problems, housing problems, other psychosocial or environmental problems, problems related to social environment, problems with access to health care services and problems with primary support group  AXIS V 30    RECOMMENDATION:   - waiting for SNF   - Psy will continue to follow as needed               ROS    Physical Exam

## 2012-01-22 NOTE — Progress Notes (Signed)
Patient has denied suicidal or homicidal ideations for the past 48 hours and indicates that it was never his intention to harm himself. Psychiatry please evaluate the need to continue suicide sitter.

## 2012-01-22 NOTE — Progress Notes (Addendum)
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 Gender: male PCP: MARTIN,NYKEDTRA, NP   History of Present Illness: [name] is a 66 y/o man adm 6/24 after ingesting a bottle of Lisinopril tabs- ?denied suicidal ideation, ?mistake, ?accident- he is demented & is currently seeing Psychiatry while hospitalized.  The chart indicates a PMHx of smoking & alcohol abuse, and apparently some HBP; but no other hx or details are available.  He presented w/ some Hyponatremia & Hypokalemia- the potassium was replaced but the sodium got worse despite fluid restrict & some Lasix.  Urine studies confirmed SIADH & sodium dropped to 118 range before saline, then Samsca used to improve his numbers.  Further work up revealed several small pulm nodules and consult requested to aide in evaluation of these lesions...   Physical Exam:   Filed Vitals:   01/21/12 1403 01/21/12 1414 01/21/12 2155 01/22/12 0459  BP: 93/61  133/64 128/73  Pulse: 97  85 86  Temp:  98.2 F (36.8 C) 98.8 F (37.1 C) 99.2 F (37.3 C)  TempSrc:   Oral Oral  Resp: 16  18 16   Height:      Weight:      SpO2: 97%  98% 96%   Gen: 66 y/o WM appears older, refuses to talk. Neck: No JVD, no thyromegaly, no carotid bruits Lungs: No use of accessory muscles, no dullness to percussion, decr BS, clear without rales or rhonchi Cardiovascular: RRR, heart sounds normal, no murmur or gallops, no peripheral edema Abdomen: soft and nontender, no HSM, BS normal Musculoskeletal: No deformities, no cyanosis or clubbing Neuro: lethargic, won't talk to me, moves all 4 w/o focal abn detected Skin: Warm, no lesions seen  Labs  Lab 01/22/12 0455 01/21/12 1800 01/21/12 1013 01/21/12 0615 01/21/12  0215 01/20/12 2216 01/20/12 1814 01/20/12 0619 01/19/12 2055 01/19/12 1825  NA 132* 132* 131* 133* 129* 131* 130* -- -- --  K 4.2 4.8 4.3 4.7 4.2 4.5 4.5 -- -- --  CL 95* 95* 91* 94* 91* 92* 92* -- -- --  CO2 26 27 25 26 23 25 24  -- -- --  GLUCOSE 107* 112* 103* 92 89 87 119* -- -- --  BUN 11 13 8 7 6 7 6  -- -- --  CREATININE 0.65 0.65 0.72 0.74 0.62 0.74 0.73 -- -- --  ALB -- -- -- -- -- -- -- -- -- --  CALCIUM 9.7 9.8 9.7 9.8 9.8 10.0 10.0 -- -- --  PHOS -- -- -- -- -- -- -- 3.7 3.6 2.7    Lab 01/16/12 2313  WBC 8.3  NEUTROABS 6.3  HGB 12.8*  HCT 37.6*  MCV 89.7  PLT 225   Liver Function Tests:  Lab 01/20/12 0619 01/19/12 2055 01/19/12 1825  AST -- -- --  ALT -- -- --  ALKPHOS -- -- --  BILITOT -- -- --  PROT -- -- --  ALBUMIN 3.5 3.4* 3.6   No results found for this basename: LIPASE:3,AMYLASE:3 in the last 168 hours  Lab 01/21/12 1800  AMMONIA 33   Cardiac Enzymes:  Lab 01/15/12 1840  CKTOTAL 146  CKMB 5.9*  CKMBINDEX --  TROPONINI <0.30   Iron Studies: No results found for this basename: IRON,TIBC,TRANSFERRIN,FERRITIN in the last 72 hours   Imaging Studies:     CXR 6/28> mild hyperinflation,  mild left basilar atx, ?nipple shadows, NAD...   CTChest> IMPRESSION:  1. Multiple small pulmonary nodules in the lungs bilaterally,  largest of which measures 7 mm in diameter in the apex of the right  upper lobe. If the patient is at high risk for bronchogenic  carcinoma, follow-up chest CT at 3-6 months is recommended. If the  patient is at low risk for bronchogenic carcinoma, follow-up chest  CT at 6-12 months is recommended. This recommendation follows the  consensus statement: Guidelines for Management of Small Pulmonary  Nodules Detected on CT Scans: A Statement from the Fleischner  Society as published in Radiology 2005; 237:395-400.  2. Atherosclerosis, including left main and two-vessel coronary  artery disease. Assessment for potential risk factor    modification, dietary therapy or pharmacologic therapy may be  warranted, if clinically indicated.  3. Small amount of pericardial fluid and/or thickening. This is  unlikely to be of hemodynamic significance at this time. No  associated pericardial calcification.  CTAbd & Pelvis> IMPRESSION:  1. Focal area of potential colonic wall thickening in the mid  sigmoid colon. This could simply represent an area of scarring  from prior diverticulitis (no current evidence of acute  diverticulitis despite the presence of diverticular disease),  however, the possibility of a colonic wall neoplasm is not  excluded. Further evaluation with sigmoidoscopy or colonoscopy may  be warranted if clinically indicated.  2. Multiple small calcifications in the pancreas, as above,  favored to represent sequelae of chronic pancreatitis.  3. Cholelithiasis without evidence to suggest acute cholecystitis.  4. Low attenuation lesions in the kidneys bilaterally. The  largest one on the right as compatible with a simple cyst. Lesions  in the left kidney are too small to definitively characterize, but  statistically favored to represent a small cyst.  5. Additional incidental findings, as above.  CTHead> IMPRESSION:  1. Chronic-appearing dilated perivascular space or chronic lacunar  infarct in the left lentiform nucleus.  2. Periventricular and corona radiata white matter hypodensities  are most compatible with chronic ischemic microvascular white  matter disease.  3. No acute intracranial findings  4. Large plugs of cerumen in the external auditory canals could  significantly degrade auditory acuity.  No results found.   Assessment & Plan:    Small bilat pulm nodules>  ~.Although there are clearly abnormalities on CT scan, they should probably be considered "microscopic" since not obvious on plain cxr .     In the setting of obvious "macroscopic" health issues,  I am very reluctatnt to embark on an  invasive w/u at this point but will arrange consevative  follow up and in the meantime see what we can do to address the patient's subjective concerns.   ~he will need follow up w/ repeat CXR & CTChest in the 3-88mo range -please have him follow up with pulmonary in future if able.  Cigarette smoker> doubt counseling much benefit w/ this dementia; it would be helpful to know his pre-hosp status.  HBP> off meds & monitored by [facility]...  Atherosclerotic dis> both in coronaries & peripherally on CT scans  Hyponatremia/ SIADH>  Eval by [facility] & Renal, now improved w/ IV saline & Samsca; seems unlikely to be due to these tiny pulm lesons esp without assoc adenopathy & adrenals are neg as well.    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 Dementia/ Confusion>   PCCM will be available on PRN basis  [facility] ACNP Adolph Pollack PCCM Pager [phone] till 3 pm If no answer page [phone] 01/22/2012, 1:26 PM  Pt independently  seen and examined and available cxr's reviewed and I agree with above findings/ imp/ plan    Sandrea Hughs, MD Pulmonary and Critical Care Medicine [facility] Cell [phone]

## 2012-01-22 NOTE — Progress Notes (Signed)
Physical Therapy Treatment Patient Details Name: Edgar Vazquez MRN: [id number] DOB: 1945/09/27 Today's Date: 01/22/2012 Time: 4098-1191 PT Time Calculation (min): 19 min  PT Assessment / Plan / Recommendation Comments on Treatment Session  Pt repeated x 2 "Where am I. Why am I here?" Pt focused on returning to room and getting in bed.Continue to recommend SNF for rehab to improve balance and maximize safety and independence with functional mobility.    Follow Up Recommendations  Skilled nursing facility;Supervision/Assistance - 24 hour    Barriers to Discharge        Equipment Recommendations  Defer to next venue    Recommendations for Other Services    Frequency Min 3X/week   Plan Discharge plan remains appropriate    Precautions / Restrictions Precautions Precautions: Fall Restrictions Weight Bearing Restrictions: No   Pertinent Vitals/Pain     Mobility  Bed Mobility Bed Mobility: Supine to Sit;Sit to Supine Supine to Sit: 6: Modified independent (Device/Increase time) Sit to Supine: 6: Modified independent (Device/Increase time) Transfers Transfers: Sit to Stand;Stand to Sit Sit to Stand: 4: Min guard Stand to Sit: 4: Min guard Details for Transfer Assistance: VCs safety. Pt impulsive and unsteady.  Ambulation/Gait Ambulation/Gait Assistance: 3: Mod assist Ambulation Distance (Feet): 225 Feet Assistive device: Rolling walker Ambulation/Gait Assistance Details: Used RW for stability. Assist to stabilize throughout ambulatio, and to navigate with RW.  Gait Pattern: Trunk flexed;Left steppage;Step-through pattern    Exercises     PT Diagnosis:    PT Problem List:   PT Treatment Interventions:     PT Goals Acute Rehab PT Goals PT Goal: Sit to Stand - Progress: Progressing toward goal PT Goal: Stand to Sit - Progress: Progressing toward goal PT Goal: Ambulate - Progress: Progressing toward goal  Visit Information  Last PT Received On: 01/22/12 Assistance  Needed: +1    Subjective Data  Subjective: "Where am I?" Patient Stated Goal: None stated   Cognition  Overall Cognitive Status: Impaired Area of Impairment: Memory;Safety/judgement;Awareness of deficits Arousal/Alertness: Awake/alert Orientation Level: Disoriented to;Place;Situation Behavior During Session: WFL for tasks performed Safety/Judgement: Impulsive;Decreased safety judgement for tasks assessed    Balance     End of Session PT - End of Session Equipment Utilized During Treatment: Gait belt Activity Tolerance: Patient tolerated treatment well Patient left: in bed;with call bell/phone within reach;with bed alarm set   GP     Rebeca Alert [facility] 01/22/2012, 2:42 PM [phone]

## 2012-01-22 NOTE — Progress Notes (Signed)
Updated Pt's brother re: Pt's medical status.  Pt's brother concerned that Pt does not have his upper teeth.  Brother asking CSW to contact [name], Pt's partner, and ask if she is willing to bring them to the hospital.  Brother willing to do this but states that [name] has his number blocked, thus she can't receive his calls.  Providence Crosby, LCSWA Clinical Social Work [phone]

## 2012-01-22 NOTE — Progress Notes (Signed)
Subjective: Patient has no new complaints today.  No acute issues overnight.  Sitter d/c'd today.  He denies any suicide ideation and has for > 48 hours.  Objective: Filed Vitals:   01/21/12 1414 01/21/12 2155 01/22/12 0459 01/22/12 1423  BP:  133/64 128/73 130/79  Pulse:  85 86 90  Temp: 98.2 F (36.8 C) 98.8 F (37.1 C) 99.2 F (37.3 C) 98.7 F (37.1 C)  TempSrc:  Oral Oral Oral  Resp:  18 16 18   Height:      Weight:      SpO2:  98% 96% 99%   Weight change:   Intake/Output Summary (Last 24 hours) at 01/22/12 1518 Last data filed at 01/22/12 1300  Gross per 24 hour  Intake    123 ml  Output    400 ml  Net   -277 ml    General: Alert, awake, in no acute distress.  HEENT: No bruits, no goiter.  Heart: Regular rate and rhythm, without murmurs, rubs, gallops.  Lungs: Clear to auscultation, bilateral air movement.  Abdomen: Soft, nontender, nondistended, positive bowel sounds.  Neuro: Patient responds to questions appropriately, moves all extremities.  Psych: Flat affect  Lab Results:  Basename 01/22/12 0455 01/21/12 1800 01/20/12 0619 01/19/12 2055  NA 132* 132* -- --  K 4.2 4.8 -- --  CL 95* 95* -- --  CO2 26 27 -- --  GLUCOSE 107* 112* -- --  BUN 11 13 -- --  CREATININE 0.65 0.65 -- --  CALCIUM 9.7 9.8 -- --  MG -- -- -- --  PHOS -- -- 3.7 3.6    Basename 01/20/12 0619 01/19/12 2055  AST -- --  ALT -- --  ALKPHOS -- --  BILITOT -- --  PROT -- --  ALBUMIN 3.5 3.4*   No results found for this basename: LIPASE:2,AMYLASE:2 in the last 72 hours No results found for this basename: WBC:2,NEUTROABS:2,HGB:2,HCT:2,MCV:2,PLT:2 in the last 72 hours No results found for this basename: CKTOTAL:3,CKMB:3,CKMBINDEX:3,TROPONINI:3 in the last 72 hours No components found with this basename: POCBNP:3 No results found for this basename: DDIMER:2 in the last 72 hours No results found for this basename: HGBA1C:2 in the last 72 hours No results found for this basename:  CHOL:2,HDL:2,LDLCALC:2,TRIG:2,CHOLHDL:2,LDLDIRECT:2 in the last 72 hours No results found for this basename: TSH,T4TOTAL,FREET3,T3FREE,THYROIDAB in the last 72 hours  Basename 01/21/12 1800 01/19/12 1825  VITAMINB12 1058* >2000*  FOLATE >20.0 --  FERRITIN -- --  TIBC -- --  IRON -- --  RETICCTPCT -- --    Micro Results: Recent Results (from the past 240 hour(s))  MRSA PCR SCREENING     Status: Normal   Collection Time   01/15/12  2:31 AM      Component Value Range Status Comment   MRSA by PCR NEGATIVE  NEGATIVE Final   CULTURE, BLOOD (ROUTINE X 2)     Status: Normal (Preliminary result)   Collection Time   01/16/12 11:04 PM      Component Value Range Status Comment   Specimen Description BLOOD RIGHT ANTECUBITAL   Final    Special Requests BOTTLES DRAWN AEROBIC AND ANAEROBIC 10CC   Final    Culture  Setup Time [id number]   Final    Culture     Final    Value:        BLOOD CULTURE RECEIVED NO GROWTH TO DATE CULTURE WILL BE HELD FOR 5 DAYS BEFORE ISSUING A FINAL NEGATIVE REPORT   Report Status PENDING   Incomplete  CULTURE, BLOOD (ROUTINE X 2)     Status: Normal (Preliminary result)   Collection Time   01/16/12 11:13 PM      Component Value Range Status Comment   Specimen Description BLOOD LEFT HAND   Final    Special Requests BOTTLES DRAWN AEROBIC AND ANAEROBIC 4CC   Final    Culture  Setup Time [id number]   Final    Culture     Final    Value:        BLOOD CULTURE RECEIVED NO GROWTH TO DATE CULTURE WILL BE HELD FOR 5 DAYS BEFORE ISSUING A FINAL NEGATIVE REPORT   Report Status PENDING   Incomplete   URINE CULTURE     Status: Normal   Collection Time   01/17/12  1:35 PM      Component Value Range Status Comment   Specimen Description URINE, CLEAN CATCH   Final    Special Requests Normal   Final    Culture  Setup Time [id number]   Final    Colony Count 30,000 COLONIES/ML   Final    Culture     Final    Value: Multiple bacterial morphotypes present, none predominant.  Suggest appropriate recollection if clinically indicated.   Report Status 01/18/2012 FINAL   Final     Studies/Results: No results found.  Medications: I have reviewed the patient's current medications.  Assessment and plan:   Polysubstance abuse (01/16/2012) and Suicidal attempt  -Psych on board will f/u with their recommendation.  -Will need placement at discharge  -Continue sitter for now.  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  -Continue fluid restriction.  -Continue sodium tablets, Currently improved after Samsca administration which has been discontinued.  - Will follow nephrology rec's.  I appreciate Nephrology's help with this case.  - CT scans were obtained which showed pulmonary nodules as well as colonic wall thickening in the mid sigmoid colon. Patient will need colonoscopy as outpatient.  Very much thank pulmonology for their input - Have patient obtain repeat CT scan of chest in 3-6 months and f/u with pulmonology as outpatient.  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 Will add norvasc  -Also PRN hydralazine for SBP > 185 or DBP > 110.   Dementia  -Continue Namenda  - HIV non reactive, RPR negative, Vitamin B12 elevated, Ammonia level within normal limits.  Protein calorie malnutrition and Hypoalbuminemia  -start ensure and encourage PO intake.   Tobacco abuse  -Continue nicotine patch   Lung multiple lymph nodes:  - Pulmonology on board and we will f/u with their recommendations. -Will need CT chest in 3-6 months.   Diverticulosis and Colonic wall thickening  -will need colonoscopy in near future for screening purposes.   DVT: lovenox.  Disposition to SNF likely tomorrow.    LOS: 8 days   Penny Pia M.D.  [facility] 01/22/2012, 3:18 PM

## 2012-01-23 DIAGNOSIS — E46 Unspecified protein-calorie malnutrition: Secondary | ICD-10-CM

## 2012-01-23 LAB — CULTURE, BLOOD (ROUTINE X 2)
Culture: NO GROWTH
Culture: NO GROWTH

## 2012-01-23 LAB — BASIC METABOLIC PANEL
BUN: 11 mg/dL (ref 6–23)
CO2: 25 mEq/L (ref 19–32)
Chloride: 99 mEq/L (ref 96–112)
Creatinine, Ser: 0.71 mg/dL (ref 0.50–1.35)

## 2012-01-23 MED ORDER — SODIUM CHLORIDE 1 G PO TABS: 2.0000 g | ORAL_TABLET | Freq: Three times a day (TID) | ORAL | Status: DC

## 2012-01-23 MED ORDER — FOLIC ACID 1 MG PO TABS
1.0000 mg | ORAL_TABLET | Freq: Every day | ORAL | Status: DC
  Administered 2012-01-23: 1 mg via ORAL
  Filled 2012-01-23: qty 1

## 2012-01-23 MED ORDER — FOLIC ACID 1 MG PO TABS: 1.0000 mg | ORAL_TABLET | Freq: Every day | ORAL | Status: DC

## 2012-01-23 MED ORDER — MEMANTINE HCL 10 MG PO TABS: 10.0000 mg | ORAL_TABLET | Freq: Two times a day (BID) | ORAL | Status: DC

## 2012-01-23 MED ORDER — THIAMINE HCL 100 MG PO TABS: 100.0000 mg | ORAL_TABLET | Freq: Every day | ORAL | Status: DC

## 2012-01-23 MED ORDER — NICOTINE 14 MG/24HR TD PT24: 1.0000 | MEDICATED_PATCH | Freq: Every day | TRANSDERMAL | Status: AC

## 2012-01-23 MED ORDER — AMLODIPINE BESYLATE 5 MG PO TABS: 5.0000 mg | ORAL_TABLET | Freq: Every day | ORAL | Status: DC

## 2012-01-23 MED ORDER — VITAMIN B-1 100 MG PO TABS
100.0000 mg | ORAL_TABLET | Freq: Every day | ORAL | Status: DC
  Administered 2012-01-23: 100 mg via ORAL
  Filled 2012-01-23: qty 1

## 2012-01-23 MED ORDER — ENSURE COMPLETE PO LIQD: 237.0000 mL | Freq: Two times a day (BID) | ORAL | Status: DC

## 2012-01-23 NOTE — Progress Notes (Addendum)
Per MD, Pt may be ready for d/c today.  Dois Davenport at [facility] made aware and asking if family can come in and sign paperwork and answer questions re: Pt.  Spoke with Pt's brother who is willing to go to [facility].  Provided Mr. Bressman with facility information, including address and phone number.    Mr. Almanza requesting a call from MD.  CSW notified MD that Pt's brother would like to speak with him.  Notified Dois Davenport that Pt's brother will be by today.    Dois Davenport requesting LOG.  Faxed LOG with note on fax cover sheet stating that the LOG will be updated by Zack to reflect today's date, if Pt does, indeed, d/c today.  T/c from MD stating that he's made 3 attempts to contact Pt's brother and that the line is busy.  MD stated that he will try again.  T/c from Pt's brother stating that [facility] contacted him and informed him that he needs to sign Pt in.  Mr. Cheever uncomfortable with signing anything, as he is concerned that he will become responsible for Pt's bill.  Mr. Kaigler stating that he will not sign any documents but will give verbal permission to the facility to provide care for Pt.  Spoke with Dois Davenport at [facility] who stated that someone will need to sign Pt in.  CSW called Pt's brother back to discuss signing Pt in.  Mr. Breighner agreed to do so and asked that CSW call Dois Davenport and alert her to him arrive at [facility] by 1500.  Notified Dois Davenport.  CSW to continue to follow.  Providence Crosby, LCSWA Clinical Social Work [phone]

## 2012-01-23 NOTE — Progress Notes (Signed)
Patient discharged to [facility] in stable condition.  Report called and given to nurse at facility. Transported via [facility] with belongings.

## 2012-01-23 NOTE — Progress Notes (Addendum)
Edgar Vazquez is more verbal tonight than he has been over the last 48 hours. He constantly calls requesting drinks. It has been explained that he is on a fluid restriction. Staff offered to assist him with showering and he became very upset and declined. Patients clothing smells of urine. His gait is very unsteady. Staff will continue to monitor. Bed alarm in place and bed in the lowest position.

## 2012-01-23 NOTE — Progress Notes (Signed)
Chart review.  Pt's IVC expires today.  Discussed with MD yesterday who agreed that IVC does not need to be renewed at this time, as Pt has been calm, cooperative and has made no additional attempts to leave the unit.  CSW to continue to follow.  Providence Crosby, LCSWA Clinical Social Work [phone]

## 2012-01-23 NOTE — Progress Notes (Signed)
Per MD, Pt ready for d/c.  Notified facility.  Faxed d/c summary.  Facility ready to receive Pt.  Notified Pt and RN.  Arranged for transportation.  Pt to be d/c'd.  Providence Crosby, LCSWA Clinical Social Work [phone]

## 2012-01-23 NOTE — Consult Note (Signed)
Patient Identification:  Edgar Vazquez Date of Evaluation:  01/23/2012 Reason for Consult: Intentional overdose vs. overdose  Referring Provider: Dr. Gonzella Lex   History of Present Illness:Pt says the woman's son uses drugs.  He went into  the son's room and claims they accused him of taking the son's pills   Interval hx;   Seen today and chart reviewed. Getting discharged today. Was watching TV but could not tell what he was watching.  Very poor historian.  No aggressive behavior recently. Stays on bed mostly.   Past Psychiatric History:   unknown;  Pt is unable to provide past history  Past Medical History:e with woman, Edgar Vazquez, and her grown son.  He claims the son blames him for taking medications after pt found pills in the room     History reviewed. No pertinent past medical history.     Past Surgical History  Procedure Date  . Ankle surgery     Allergies:  Allergies  Allergen Reactions  . Penicillins     Current Medications:  Prior to Admission medications   Not on File    Social History:    reports that he has been smoking.  He does not have any smokeless tobacco history on file. He reports that he drinks alcohol. He reports that he does not use illicit drugs.   Family History:    No family history on file.  Mental Status Examination/Evaluation: Objective:  Appearance: Disheveled and appears neglected and undernourished  Psychomotor Activity:  Decreased  Eye Contact::  Fair  Speech:  very limited usully "don't know'  Volume:  Decreased  Mood:  No answer  Affect:  Blunt and Depressed  Thought Process:  disorganized  Orientation:  Other:  Not oriented to date, location, situation ; just name; knows he is in a hospital  Thought Content:  No avh  Suicidal Thoughts:  No  Homicidal Thoughts:  No  Judgement:  Poor  Insight:  Lacking    DIAGNOSIS:   AXIS I   Dementia; Alcohol Dependence r/o  benign familial tremor vs alcohol tremor  AXIS II  Deffered  AXIS  III See medical notes.  AXIS IV economic problems, educational problems, housing problems, other psychosocial or environmental problems, problems related to social environment, problems with access to health care services and problems with primary support group  AXIS V 30    RECOMMENDATION:   - waiting for SNF   - Psy will sign off              ROS    Physical Exam

## 2012-01-23 NOTE — Progress Notes (Signed)
Report called to nurse at [facility].

## 2012-01-23 NOTE — Progress Notes (Signed)
T/c with Mrs. Edgar Vazquez.  Mrs. Edgar Vazquez stated that Pt's identification should be in his wallet and that his wallet was on his person when he went to the ED.  She stated that he was wearing his teeth and so he should have those, too.  CSW to ask RN these items are being secured.  Providence Crosby, LCSWA Clinical Social Work [phone]

## 2012-01-23 NOTE — Progress Notes (Signed)
Pt. Agreed to allow staff to assist him with a quick bed bath. Linen changed. Pt. Continues to request something to drink. Reminded pt. Of his fluid restriction. Pt. Has had a total of 360cc of fluid allowance. Diyan Dave, Cheryll Dessert

## 2012-01-23 NOTE — Discharge Summary (Signed)
Physician Discharge Summary  Edgar Vazquez GNF:[id number] DOB: [birth year] DOA: 01/14/2012  PCP: Lehman Prom, NP  Admit date: 01/14/2012 Discharge date: 01/23/2012  Discharge Diagnoses:  Principal Problem:  *Polysubstance abuse Active Problems:  HYPONATREMIA  HYPERTENSION, BENIGN ESSENTIAL  Pulmonary nodules   Discharge Condition: Stable  Disposition:   Diet: Pureed diet (dysphagia 1) also fluid restrict to 1000 ml per day.  History of present illness:  From original HPI: 66 year old male with no significant past medical history who presented to ED with ingestion of approximately 60 pills of lisinopril. Patient called EMS as he felt what he described as interpersonal differences. Patient denied chest pain, shortness of breath, lightheadedness or loss of consciousness. No complaints of abdominal pain, nausea or vomiting. Patient denies suicidal or homicidal ideations.   Hospital Course:  Medications: I have reviewed the patient's current medications.  Assessment and plan:   Polysubstance abuse (01/16/2012) and Suicidal attempt  -Psych on board will f/u with their recommendation.  -Will need placement at discharge  -Continue sitter for now.  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  -Continue fluid restriction.  -Continue sodium tablets, Currently improved after Samsca administration which has been discontinued.  - Nephrology was consulted for hyponatremia and provided recommendations, they were able to sign off with improvement in patient's condition.   - CT scans were obtained which showed pulmonary nodules as well as colonic wall thickening in the mid sigmoid colon. Patient will need colonoscopy as outpatient.  Pulmonology was consulted for pulmonary nodules and their impression was:  Although there are clearly abnormalities on CT scan, they should probably be  considered "microscopic" since not obvious on plain cxr .  In the setting of obvious "macroscopic" health issues, I am very reluctatnt to embark on an invasive w/u at this point but will arrange consevative follow up and in the meantime see what we can do to address the patient's subjective concerns.  ~he will need follow up w/ repeat CXR & CTChest in the 3-51mo range  -please have him follow up with pulmonary in future if able.  - Have patient obtain repeat CT scan of chest in 3-6 months and f/u with pulmonology as outpatient. 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 Will add norvasc   Dementia  -Continue Namenda  - HIV non reactive, RPR negative, Vitamin B12 elevated, Ammonia level within normal limits.   Protein calorie malnutrition and Hypoalbuminemia  -start ensure and encourage PO intake.   Tobacco abuse  -Continue nicotine patch   Diverticulosis and Colonic wall thickening  -will need colonoscopy in near future for screening purposes.    Discharge Exam: Filed Vitals:   01/23/12 0630  BP: 113/69  Pulse: 69  Temp: 98.3 F (36.8 C)  Resp: 18   Filed Vitals:   01/22/12 0459 01/22/12 1423 01/22/12 2244 01/23/12 0630  BP: 128/73 130/79 115/67 113/69  Pulse: 86 90 78 69  Temp: 99.2 F (37.3 C) 98.7 F (37.1 C) 98 F (36.7 C) 98.3 F (36.8 C)  TempSrc: Oral Oral Oral Oral  Resp: 16 18 20 18   Height:      Weight:      SpO2: 96% 99% 97% 96%   General: Patient in NAD, Awake and alert Cardiovascular: RRR normal S1 and S2 Respiratory: Clear to auscultation, no wheezes, speaking in full sentences, no increase work  of breathing. Abd: Soft, NT, ND  Discharge Instructions:  Patient will need to follow up with pulmonologist in 3-4 months as well as repeat chest CT due to pulmonary nodules.  Also will need to follow up with Gastroenterologist for colonoscopy.  Have discussed with patient, brother,  and first cousin and they verbalize their understanding and agreement.  Discharge Orders    Future Orders Please Complete By Expires   Diet - low sodium heart healthy      Increase activity slowly      Discharge instructions      Comments:   Please take medications as indicated and fluid restrict yourself to 800 or 1000 ml per day.  Follow up with your primary care physician in 1-2 weeks or sooner should any new concerns arise.   Call MD for:  persistant dizziness or light-headedness      Call MD for:  extreme fatigue      Call MD for:  persistant nausea and vomiting        Medication List  As of 01/23/2012  1:04 PM   TAKE these medications         amLODipine 5 MG tablet   Commonly known as: NORVASC   Take 1 tablet (5 mg total) by mouth daily.      feeding supplement Liqd   Take 237 mLs by mouth 2 (two) times daily between meals.      folic acid 1 MG tablet   Commonly known as: FOLVITE   Take 1 tablet (1 mg total) by mouth daily.      memantine 10 MG tablet   Commonly known as: NAMENDA   Take 1 tablet (10 mg total) by mouth 2 (two) times daily.      nicotine 14 mg/24hr patch   Commonly known as: NICODERM CQ - dosed in mg/24 hours   Place 1 patch onto the skin daily.      sodium chloride 1 G tablet   Take 2 tablets (2 g total) by mouth 3 (three) times daily with meals.      thiamine 100 MG tablet   Take 1 tablet (100 mg total) by mouth daily.              The results of significant diagnostics from this hospitalization (including imaging, microbiology, ancillary and laboratory) are listed below for reference.    Significant Diagnostic Studies: Dg Chest 2 View  01/19/2012  *RADIOLOGY REPORT*  Clinical Data: Abdominal prior chest x-ray  CHEST - 2 VIEW  Comparison: 01/16/2012  Findings: Cardiomediastinal silhouette is stable.  No acute infiltrate or pulmonary edema.  Mild hyperinflation.  Mild left basilar atelectasis.  Nodular density projecting bilateral lower lobe  may represent nipple shadow.  Repeat frontal view with nipple markers is recommended for confirmation.  Bony thorax is unremarkable.  IMPRESSION: No acute infiltrate or pulmonary edema.  Mild hyperinflation.  Mild left basilar atelectasis.  Nodular density projecting bilateral lower lobe may represent nipple shadow.  Repeat frontal view with nipple markers is recommended for confirmation.  Original Report Authenticated By: Natasha Mead, M.D.   Ct Head Wo Contrast  01/19/2012  *RADIOLOGY REPORT*  Clinical Data: Altered mental status.  Drooling.  Hyponatremia.  CT HEAD WITHOUT CONTRAST  Technique:  Contiguous axial images were obtained from the base of the skull through the vertex without contrast.  Comparison: 04/06/2007  Findings: Lacunar infarct or non-dilated perivascular space noted in the left lentiform nucleus; this is well defined and appears chronic.  Otherwise, the brain stem, cerebellum, cerebral peduncles, thalami, basal ganglia, basilar cisterns, and ventricular system appear unremarkable.  Periventricular and corona radiata white matter hypodensities are most compatible with chronic ischemic microvascular white matter disease.  No intracranial hemorrhage, mass lesion, or acute infarction is identified.  Large plugs of cerumen occlude the external auditory canals.  These could significantly affect auditory acuity.  IMPRESSION:  1.  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 2.  Periventricular and corona radiata white matter hypodensities are most compatible with chronic ischemic microvascular white matter disease. 3.  No acute intracranial findings 4.  Large plugs of cerumen in the external auditory canals could significantly degrade auditory acuity.  Original Report Authenticated By: Dellia Cloud, M.D.   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  CT CHEST, ABDOMEN AND PELVIS WITH  CONTRAST  Technique:  Multidetector CT imaging of the chest, abdomen and pelvis was performed following the standard protocol during bolus administration of intravenous contrast.  Contrast: OMNIPAQUE IOHEXOL 300 MG/ML  SOLN  Comparison:  No priors.  CT CHEST  Findings:  Mediastinum: Heart size is normal. Small amount of pericardial fluid and/or thickening.  No associated pericardial calcification. Mild ectasia of the descending thoracic aorta (3.9 cm in diameter). There is atherosclerosis of the thoracic aorta, the great vessels of the mediastinum and the coronary arteries, including calcified atherosclerotic plaque in the left main, left anterior descending and right coronary arteries. No pathologically enlarged mediastinal or hilar lymph nodes. Esophagus is unremarkable in appearance. Separate origin of the left vertebral artery directly off the aortic arch is incidentally noted (a normal anatomical variant).  Lungs/Pleura: There are multiple small pulmonary nodules in the lungs bilaterally, the largest of which is 7 mm in diameter in the apex of the right upper lobe (image 16 of series 5).  The largest nodule in the left lung is a 5 mm nodule in the left upper lobe (image 26 of series 5).  No acute consolidative airspace disease. No pleural effusions.  Musculoskeletal: There are no aggressive appearing lytic or blastic lesions noted in the visualized portions of the skeleton.  IMPRESSION:  1.  Multiple small pulmonary nodules in the lungs bilaterally, largest of which measures 7 mm in diameter in the apex of the right upper lobe. If the patient is at high risk for bronchogenic carcinoma, follow-up chest CT at 3-6 months is recommended.  If the patient is at low risk for bronchogenic carcinoma, follow-up chest CT at 6-12 months is recommended.  This recommendation follows the consensus statement: Guidelines for Management of Small Pulmonary Nodules Detected on CT Scans: A Statement from the Fleischner  Society as published in Radiology 2005; 237:395-400. 2. Atherosclerosis, including left main and two-vessel coronary artery disease.   Assessment for potential risk factor modification, dietary therapy or pharmacologic therapy may be warranted, if clinically indicated. 3.  Small amount of pericardial fluid and/or thickening.  This is unlikely to be of hemodynamic significance at this time.  No associated pericardial calcification.  CT ABDOMEN AND PELVIS  Findings:  Abdomen/Pelvis: Small gallstones layering dependently in the lumen of the gallbladder.  No signs of acute cholecystitis at this time. The appearance of the liver and bilateral adrenal glands is unremarkable.  There is a small calcification in the spleen likely represent a calcified granuloma.  In the head and the body of the pancreas there are a few  punctate calcifications, suggesting sequela of mild chronic pancreatitis.  One of these calcifications, is in close proximity to the distal pancreatic duct, however, the duct is within normal caliber measuring 3 mm.  There are low attenuation lesions in the kidneys bilaterally, the largest which measures 1.2 cm extending exophytically from the lower pole of the right kidney, compatible with a simple cyst.  Other lesions are too small to definitively characterize (statistically likely represent small cyst).  Extensive atherosclerosis of the abdominal and pelvic vasculature, without definite aneurysm or dissection.  No ascites or pneumoperitoneum and no pathologic distension of bowel.  No definite pathologic lymphadenopathy identified within the abdomen or pelvis.  The appendix is normal.  There is a focal area of potential colonic wall thickening in the sigmoid colon (best demonstrated on image 115 of series 2).  Numerous colonic diverticula are noted without surrounding inflammatory changes to suggest an acute diverticulitis at this time.  Small right inguinal hernia containing only fat is incidentally noted.   Musculoskeletal: There are no aggressive appearing lytic or blastic lesions noted in the visualized portions of the skeleton.  There is a mild compression fracture of a L1 with approximately 20% loss of anterior vertebral body height (likely remote).  IMPRESSION:  1.  Focal area of potential colonic wall thickening in the mid sigmoid colon.  This could simply represent an area of scarring from prior diverticulitis (no current evidence of acute diverticulitis despite the presence of diverticular disease), however, the possibility of a colonic wall neoplasm is not excluded.  Further evaluation with sigmoidoscopy or colonoscopy may be warranted if clinically indicated. 2.  Multiple small calcifications in the pancreas, as above, favored to represent sequelae of chronic pancreatitis. 3.  Cholelithiasis without evidence to suggest acute cholecystitis. 4.  Low attenuation lesions in the kidneys bilaterally.  The largest one on the right as compatible with a simple cyst.  Lesions in the left kidney are too small to definitively characterize, but statistically favored to represent a small cyst. 5.  Additional incidental findings, as above.  Original Report Authenticated By: Florencia Reasons, M.D.   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  CT CHEST, ABDOMEN AND PELVIS WITH CONTRAST  Technique:  Multidetector CT imaging of the chest, abdomen and pelvis was performed following the standard protocol during bolus administration of intravenous contrast.  Contrast: OMNIPAQUE IOHEXOL 300 MG/ML  SOLN  Comparison:  No priors.  CT CHEST  Findings:  Mediastinum: Heart size is normal. Small amount of pericardial fluid and/or thickening.  No associated pericardial calcification. Mild ectasia of the descending thoracic aorta (3.9 cm in diameter). There is atherosclerosis of the thoracic aorta, the great vessels of the mediastinum and the coronary arteries,  including calcified atherosclerotic plaque in the left main, left anterior descending and right coronary arteries. No pathologically enlarged mediastinal or hilar lymph nodes. Esophagus is unremarkable in appearance. Separate origin of the left vertebral artery directly off the aortic arch is incidentally noted (a normal anatomical variant).  Lungs/Pleura: There are multiple small pulmonary nodules in the lungs bilaterally, the largest of which is 7 mm in diameter in the apex of the right upper lobe (image 16 of series 5).  The largest nodule in the left lung is a 5 mm nodule in the left upper lobe (image 26 of series 5).  No acute consolidative airspace disease. No pleural effusions.  Musculoskeletal: There are no aggressive appearing lytic  or blastic lesions noted in the visualized portions of the skeleton.  IMPRESSION:  1.  Multiple small pulmonary nodules in the lungs bilaterally, largest of which measures 7 mm in diameter in the apex of the right upper lobe. If the patient is at high risk for bronchogenic carcinoma, follow-up chest CT at 3-6 months is recommended.  If the patient is at low risk for bronchogenic carcinoma, follow-up chest CT at 6-12 months is recommended.  This recommendation follows the consensus statement: Guidelines for Management of Small Pulmonary Nodules Detected on CT Scans: A Statement from the Fleischner Society as published in Radiology 2005; 237:395-400. 2. Atherosclerosis, including left main and two-vessel coronary artery disease.   Assessment for potential risk factor modification, dietary therapy or pharmacologic therapy may be warranted, if clinically indicated. 3.  Small amount of pericardial fluid and/or thickening.  This is unlikely to be of hemodynamic significance at this time.  No associated pericardial calcification.  CT ABDOMEN AND PELVIS  Findings:  Abdomen/Pelvis: Small gallstones layering dependently in the lumen of the gallbladder.  No signs of acute cholecystitis at  this time. The appearance of the liver and bilateral adrenal glands is unremarkable.  There is a small calcification in the spleen likely represent a calcified granuloma.  In the head and the body of the pancreas there are a few punctate calcifications, suggesting sequela of mild chronic pancreatitis.  One of these calcifications, is in close proximity to the distal pancreatic duct, however, the duct is within normal caliber measuring 3 mm.  There are low attenuation lesions in the kidneys bilaterally, the largest which measures 1.2 cm extending exophytically from the lower pole of the right kidney, compatible with a simple cyst.  Other lesions are too small to definitively characterize (statistically likely represent small cyst).  Extensive atherosclerosis of the abdominal and pelvic vasculature, without definite aneurysm or dissection.  No ascites or pneumoperitoneum and no pathologic distension of bowel.  No definite pathologic lymphadenopathy identified within the abdomen or pelvis.  The appendix is normal.  There is a focal area of potential colonic wall thickening in the sigmoid colon (best demonstrated on image 115 of series 2).  Numerous colonic diverticula are noted without surrounding inflammatory changes to suggest an acute diverticulitis at this time.  Small right inguinal hernia containing only fat is incidentally noted.  Musculoskeletal: There are no aggressive appearing lytic or blastic lesions noted in the visualized portions of the skeleton.  There is a mild compression fracture of a L1 with approximately 20% loss of anterior vertebral body height (likely remote).  IMPRESSION:  1.  Focal area of potential colonic wall thickening in the mid sigmoid colon.  This could simply represent an area of scarring from prior diverticulitis (no current evidence of acute diverticulitis despite the presence of diverticular disease), however, the possibility of a colonic wall neoplasm is not excluded.  Further  evaluation with sigmoidoscopy or colonoscopy may be warranted if clinically indicated. 2.  Multiple small calcifications in the pancreas, as above, favored to represent sequelae of chronic pancreatitis. 3.  Cholelithiasis without evidence to suggest acute cholecystitis. 4.  Low attenuation lesions in the kidneys bilaterally.  The largest one on the right as compatible with a simple cyst.  Lesions in the left kidney are too small to definitively characterize, but statistically favored to represent a small cyst. 5.  Additional incidental findings, as above.  Original Report Authenticated By: Florencia Reasons, M.D.   Dg Chest Port 1 View  01/16/2012  *  RADIOLOGY REPORT*  Clinical Data: 66 year old male with fever.  PORTABLE CHEST - 1 VIEW  Comparison: 04/12/2007 and earlier.  Findings: Portable AP view 2240 hours.  Confluent retrocardiac opacity.  Stable cardiac size and mediastinal contours.  No pneumothorax, pulmonary edema or definite effusion.  IMPRESSION: Left lower lobe pneumonia suspected.  PA and lateral chest radiographs would be valuable when possible.  Original Report Authenticated By: Harley Hallmark, M.D.    Microbiology: Recent Results (from the past 240 hour(s))  MRSA PCR SCREENING     Status: Normal   Collection Time   01/15/12  2:31 AM      Component Value Range Status Comment   MRSA by PCR NEGATIVE  NEGATIVE Final   CULTURE, BLOOD (ROUTINE X 2)     Status: Normal   Collection Time   01/16/12 11:04 PM      Component Value Range Status Comment   Specimen Description BLOOD RIGHT ANTECUBITAL   Final    Special Requests BOTTLES DRAWN AEROBIC AND ANAEROBIC 10CC   Final    Culture  Setup Time 01/17/2012 04:26   Final    Culture NO GROWTH 5 DAYS   Final    Report Status 01/23/2012 FINAL   Final   CULTURE, BLOOD (ROUTINE X 2)     Status: Normal   Collection Time   01/16/12 11:13 PM      Component Value Range Status Comment   Specimen Description BLOOD LEFT HAND   Final    Special  Requests BOTTLES DRAWN AEROBIC AND ANAEROBIC 4CC   Final    Culture  Setup Time 01/17/2012 04:26   Final    Culture NO GROWTH 5 DAYS   Final    Report Status 01/23/2012 FINAL   Final   URINE CULTURE     Status: Normal   Collection Time   01/17/12  1:35 PM      Component Value Range Status Comment   Specimen Description URINE, CLEAN CATCH   Final    Special Requests Normal   Final    Culture  Setup Time [id number]   Final    Colony Count 30,000 COLONIES/ML   Final    Culture     Final    Value: Multiple bacterial morphotypes present, none predominant. Suggest appropriate recollection if clinically indicated.   Report Status 01/18/2012 FINAL   Final      Labs: Basic Metabolic Panel:  Lab 01/23/12 8119 01/22/12 1820 01/22/12 0455 01/21/12 1800 01/21/12 1013 01/20/12 0619 01/19/12 2055 01/19/12 1825  NA 133* 134* 132* 132* 131* -- -- --  K 4.1 3.9 -- -- -- -- -- --  CL 99 98 95* 95* 91* -- -- --  CO2 25 21 26 27 25  -- -- --  GLUCOSE 118* 95 107* 112* 103* -- -- --  BUN 11 12 11 13 8  -- -- --  CREATININE 0.71 0.74 0.65 0.65 0.72 -- -- --  CALCIUM 9.3 9.6 9.7 9.8 9.7 -- -- --  MG -- -- -- -- -- -- -- --  PHOS -- -- -- -- -- 3.7 3.6 2.7   Liver Function Tests:  Lab 01/20/12 0619 01/19/12 2055 01/19/12 1825  AST -- -- --  ALT -- -- --  ALKPHOS -- -- --  BILITOT -- -- --  PROT -- -- --  ALBUMIN 3.5 3.4* 3.6   No results found for this basename: LIPASE:5,AMYLASE:5 in the last 168 hours  Lab 01/21/12 1800  AMMONIA 33   CBC:  Lab 01/16/12 2313  WBC 8.3  NEUTROABS 6.3  HGB 12.8*  HCT 37.6*  MCV 89.7  PLT 225   Cardiac Enzymes: No results found for this basename: CKTOTAL:5,CKMB:5,CKMBINDEX:5,TROPONINI:5 in the last 168 hours BNP: No components found with this basename: POCBNP:5 CBG:  Lab 01/23/12 0732 01/22/12 0731 01/21/12 0734 01/19/12 0833 01/19/12 0751  GLUCAP 106* 102* 93 134* 53*    Time coordinating discharge: > 35 minutes medical decision making, placing  orders, documenting, discussion with patient and brother > 15 minutes, billing.  Signed:  Penny Pia  [facility] 01/23/2012, 1:04 PM

## 2012-01-23 NOTE — Progress Notes (Signed)
Clinical Social Work Department CLINICAL SOCIAL WORK PLACEMENT NOTE 01/23/2012  Patient:  Edgar Vazquez, Edgar Vazquez  Account Number:  [id number] Admit date:  01/14/2012  Clinical Social Worker:  Doroteo Glassman  Date/time:  01/23/2012 02:45 PM  Clinical Social Work is seeking post-discharge placement for this patient at the following level of care:   SKILLED NURSING   (*CSW will update this form in Epic as items are completed)   02/13/2012  Patient/family provided with [facility] Department of Clinical Social Work's list of facilities offering this level of care within the geographic area requested by the patient (or if unable, by the patient's family).  01/16/2012  Patient/family informed of their freedom to choose among providers that offer the needed level of care, that participate in Medicare, Medicaid or managed care program needed by the patient, have an available bed and are willing to accept the patient.  01/16/2012  Patient/family informed of [facility]' ownership interest in [facility], as well as of the fact that they are under no obligation to receive care at this facility.  PASARR submitted to [employer] on Existing PASARR number received from [employer] on   FL2 transmitted to all facilities in geographic area requested by pt/family on  01/14/2012 FL2 transmitted to all facilities within larger geographic area on   Patient informed that his/her managed care company has contracts with or will negotiate with  certain facilities, including the following:     Patient/family informed of bed offers received:  01/19/2012 Patient chooses bed at [facility] Physician recommends and patient chooses bed at    Patient to be transferred to [facility] on  01/23/2012 Patient to be transferred to facility by [facility]  The following physician request were entered in Epic:   Additional Comments:

## 2012-02-16 ENCOUNTER — Other Ambulatory Visit (HOSPITAL_BASED_OUTPATIENT_CLINIC_OR_DEPARTMENT_OTHER): Payer: Self-pay | Admitting: Internal Medicine

## 2012-02-16 DIAGNOSIS — R911 Solitary pulmonary nodule: Secondary | ICD-10-CM

## 2012-03-27 ENCOUNTER — Observation Stay (HOSPITAL_COMMUNITY)
Admission: EM | Admit: 2012-03-27 | Discharge: 2012-04-05 | Disposition: A | Discharge status: Other Acute Inpatient Hospital | Payer: Medicaid Other | Attending: Emergency Medicine | Admitting: Emergency Medicine

## 2012-03-27 ENCOUNTER — Encounter (HOSPITAL_COMMUNITY): Payer: Self-pay | Admitting: *Deleted

## 2012-03-27 DIAGNOSIS — F17200 Nicotine dependence, unspecified, uncomplicated: Secondary | ICD-10-CM | POA: Insufficient documentation

## 2012-03-27 DIAGNOSIS — F0391 Unspecified dementia with behavioral disturbance: Principal | ICD-10-CM | POA: Insufficient documentation

## 2012-03-27 DIAGNOSIS — E46 Unspecified protein-calorie malnutrition: Secondary | ICD-10-CM | POA: Diagnosis present

## 2012-03-27 DIAGNOSIS — R4689 Other symptoms and signs involving appearance and behavior: Secondary | ICD-10-CM

## 2012-03-27 DIAGNOSIS — F1920 Other psychoactive substance dependence, uncomplicated: Secondary | ICD-10-CM | POA: Insufficient documentation

## 2012-03-27 DIAGNOSIS — K5730 Diverticulosis of large intestine without perforation or abscess without bleeding: Secondary | ICD-10-CM | POA: Insufficient documentation

## 2012-03-27 DIAGNOSIS — Z79899 Other long term (current) drug therapy: Secondary | ICD-10-CM | POA: Insufficient documentation

## 2012-03-27 DIAGNOSIS — R6250 Unspecified lack of expected normal physiological development in childhood: Secondary | ICD-10-CM | POA: Insufficient documentation

## 2012-03-27 DIAGNOSIS — F39 Unspecified mood [affective] disorder: Secondary | ICD-10-CM | POA: Diagnosis present

## 2012-03-27 DIAGNOSIS — F0390 Unspecified dementia without behavioral disturbance: Secondary | ICD-10-CM | POA: Diagnosis present

## 2012-03-27 DIAGNOSIS — Z23 Encounter for immunization: Secondary | ICD-10-CM | POA: Insufficient documentation

## 2012-03-27 DIAGNOSIS — F03918 Unspecified dementia, unspecified severity, with other behavioral disturbance: Principal | ICD-10-CM | POA: Insufficient documentation

## 2012-03-27 DIAGNOSIS — F1010 Alcohol abuse, uncomplicated: Secondary | ICD-10-CM | POA: Diagnosis present

## 2012-03-27 DIAGNOSIS — I10 Essential (primary) hypertension: Secondary | ICD-10-CM | POA: Diagnosis present

## 2012-03-27 DIAGNOSIS — I426 Alcoholic cardiomyopathy: Secondary | ICD-10-CM | POA: Diagnosis present

## 2012-03-27 HISTORY — DX: Diverticulosis of intestine, part unspecified, without perforation or abscess without bleeding: K57.90

## 2012-03-27 HISTORY — DX: Unspecified dementia, unspecified severity, without behavioral disturbance, psychotic disturbance, mood disturbance, and anxiety: F03.90

## 2012-03-27 HISTORY — DX: Essential (primary) hypertension: I10

## 2012-03-27 HISTORY — DX: Other psychoactive substance dependence, uncomplicated: F19.20

## 2012-03-27 LAB — URINALYSIS, ROUTINE W REFLEX MICROSCOPIC
Glucose, UA: NEGATIVE mg/dL
Leukocytes, UA: NEGATIVE
Nitrite: NEGATIVE
Protein, ur: NEGATIVE mg/dL

## 2012-03-27 LAB — RAPID URINE DRUG SCREEN, HOSP PERFORMED
Amphetamines: NOT DETECTED
Barbiturates: NOT DETECTED

## 2012-03-27 LAB — CBC
HCT: 36.5 % — ABNORMAL LOW (ref 39.0–52.0)
MCV: 93.4 fL (ref 78.0–100.0)
RBC: 3.91 MIL/uL — ABNORMAL LOW (ref 4.22–5.81)
WBC: 7.5 10*3/uL (ref 4.0–10.5)

## 2012-03-27 LAB — COMPREHENSIVE METABOLIC PANEL
AST: 13 U/L (ref 0–37)
BUN: 7 mg/dL (ref 6–23)
CO2: 29 mEq/L (ref 19–32)
Chloride: 97 mEq/L (ref 96–112)
Creatinine, Ser: 0.62 mg/dL (ref 0.50–1.35)
GFR calc non Af Amer: >90 mL/min (ref >90)
Total Bilirubin: 0.1 mg/dL — ABNORMAL LOW (ref 0.3–1.2)

## 2012-03-27 LAB — ETHANOL: Alcohol, Ethyl (B): <11 mg/dL (ref 0–11)

## 2012-03-27 LAB — ACETAMINOPHEN LEVEL: Acetaminophen (Tylenol), Serum: <15 ug/mL (ref 10–30)

## 2012-03-27 MED ORDER — FOLIC ACID 1 MG PO TABS
1.0000 mg | ORAL_TABLET | Freq: Every day | ORAL | Status: DC
  Administered 2012-03-27 – 2012-04-05 (×10): 1 mg via ORAL
  Filled 2012-03-27 (×10): qty 1

## 2012-03-27 MED ORDER — VITAMIN B-1 100 MG PO TABS
100.0000 mg | ORAL_TABLET | Freq: Every day | ORAL | Status: DC
  Administered 2012-03-27 – 2012-04-05 (×10): 100 mg via ORAL
  Filled 2012-03-27 (×10): qty 1

## 2012-03-27 MED ORDER — AMLODIPINE BESYLATE 5 MG PO TABS
5.0000 mg | ORAL_TABLET | Freq: Every day | ORAL | Status: DC
  Administered 2012-03-27 – 2012-04-05 (×10): 5 mg via ORAL
  Filled 2012-03-27 (×10): qty 1

## 2012-03-27 MED ORDER — IBUPROFEN 600 MG PO TABS
600.0000 mg | ORAL_TABLET | Freq: Three times a day (TID) | ORAL | Status: DC | PRN
  Filled 2012-03-27: qty 1

## 2012-03-27 MED ORDER — NICOTINE 21 MG/24HR TD PT24
21.0000 mg | MEDICATED_PATCH | Freq: Every day | TRANSDERMAL | Status: DC
  Filled 2012-03-27 (×2): qty 1

## 2012-03-27 MED ORDER — ENSURE COMPLETE PO LIQD
237.0000 mL | Freq: Two times a day (BID) | ORAL | Status: DC
  Administered 2012-03-27 – 2012-04-05 (×19): 237 mL via ORAL
  Filled 2012-03-27 (×36): qty 237

## 2012-03-27 MED ORDER — SODIUM CHLORIDE 1 G PO TABS
2.0000 g | ORAL_TABLET | Freq: Three times a day (TID) | ORAL | Status: DC
  Administered 2012-03-27 – 2012-04-05 (×27): 2 g via ORAL
  Filled 2012-03-27 (×32): qty 2

## 2012-03-27 MED ORDER — MEMANTINE HCL 10 MG PO TABS
10.0000 mg | ORAL_TABLET | Freq: Two times a day (BID) | ORAL | Status: DC
  Administered 2012-03-27 – 2012-04-05 (×18): 10 mg via ORAL
  Filled 2012-03-27 (×23): qty 1

## 2012-03-27 MED ORDER — ALUM & MAG HYDROXIDE-SIMETH 200-200-20 MG/5ML PO SUSP
30.0000 mL | ORAL | Status: DC | PRN
  Administered 2012-04-04: 30 mL via ORAL
  Filled 2012-03-27: qty 30

## 2012-03-27 MED ORDER — ONDANSETRON HCL 4 MG PO TABS: 4.0000 mg | ORAL_TABLET | Freq: Three times a day (TID) | ORAL | Status: DC | PRN

## 2012-03-27 MED ORDER — ACETAMINOPHEN 325 MG PO TABS: 650.0000 mg | ORAL_TABLET | ORAL | Status: DC | PRN

## 2012-03-27 MED ORDER — ZOLPIDEM TARTRATE 5 MG PO TABS
5.0000 mg | ORAL_TABLET | Freq: Every evening | ORAL | Status: DC | PRN
  Administered 2012-03-27 – 2012-04-03 (×6): 5 mg via ORAL
  Filled 2012-03-27 (×7): qty 1
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
## 2012-03-27 NOTE — ED Notes (Signed)
Pt in triage, sitter at bedside.  Pt resting comfortably at this time.  Waiting to be moved to [facility]

## 2012-03-27 NOTE — ED Provider Notes (Signed)
Medical screening examination/treatment/procedure(s) were performed by non-physician practitioner and as supervising physician I was immediately available for consultation/collaboration.  Sunnie Nielsen, MD 03/27/12 [phone]
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
## 2012-03-27 NOTE — ED Notes (Signed)
Pts belongings, blue jeans, shirt, underwear, belt, hat, gold tone ring and watch.  Pt wanded by security.

## 2012-03-28 DIAGNOSIS — F1920 Other psychoactive substance dependence, uncomplicated: Secondary | ICD-10-CM

## 2012-03-28 DIAGNOSIS — F0391 Unspecified dementia with behavioral disturbance: Principal | ICD-10-CM

## 2012-03-28 DIAGNOSIS — R6250 Unspecified lack of expected normal physiological development in childhood: Secondary | ICD-10-CM

## 2012-03-28 MED ORDER — RISPERIDONE 0.5 MG PO TABS
0.5000 mg | ORAL_TABLET | Freq: Two times a day (BID) | ORAL | Status: DC
  Administered 2012-03-28 – 2012-04-04 (×13): 0.5 mg via ORAL
  Filled 2012-03-28 (×14): qty 1

## 2012-03-28 NOTE — ED Notes (Signed)
patient ambulating to bathroom

## 2012-03-28 NOTE — ED Notes (Signed)
Ambulated to bathroom
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
## 2012-03-28 NOTE — ED Notes (Signed)
Dr Sidonie Dickens in to see pt

## 2012-03-28 NOTE — Progress Notes (Signed)
Potential offers from ALF pending pt's dementia/agressive behaviors being addressed. ALF requesting information re: any new meds or med adjustments that may assist in managing pt's behaviors. No psych consult noted. ALF will likely need to assess pt in ED prior to making offer. Will continue to follow.  Dellie Burns, MSW, ZOXWR [phone] (ED coverage)
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
## 2012-03-28 NOTE — ED Notes (Signed)
Patient has one personal belonging bag in locker 59
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
## 2012-03-29 NOTE — BHH Counselor (Signed)
Contacted the following hospitals who have no current bed availability:  [facility]  Faxed info to the following facilities: [facility] - declined per Agustin Cree for pt [facility]

## 2012-03-30 ENCOUNTER — Encounter (HOSPITAL_COMMUNITY): Payer: Self-pay | Admitting: *Deleted

## 2012-03-30 ENCOUNTER — Emergency Department (HOSPITAL_COMMUNITY): Payer: Medicaid Other

## 2012-03-30 NOTE — ED Provider Notes (Signed)
Patient resting comfortably, no issues as per nursing. As per telepsych overnight, recommend inpatient psych hospitalization. Pending placement.   Edgar Canal, MD 03/30/12 [phone]

## 2012-03-30 NOTE — BH Assessment (Signed)
Assessment Note   Edgar Vazquez is an 66 y.o. male who initially presented to [facility] Emergency Department by IVC due to increased verbal and physical aggression. Per previous assessment on 03/27/12, pt resides at [facility] who stated that pt has became more verbally and physically aggressive with staff and residents over the past 30 days. Pt has a noted diagnosis of Dementia. Pt's brother reported to previous assessor that pt exhibited defiant behaviors, such as cutting his locator band off his leg with a knife. During reassessment, patient was observed by writer to exhibit irritability evidenced by pt giving no eye contact with short & abrupt answers. Patient reported to Clinical research associate that he does not know why he is currently in the emergency department and that he hates being here. "I guess I'm stuck here ain't I? I was living with my mother. Don't know how I ended up here." Patient exhibits a flat affect with depressive symptoms: social isolation, irritability, and increased sleep. Patient is currently pending psychiatric placement at this time. Patient denies SI/HI/AVH.   Axis I: Mood Disorder NOS Axis II: No diagnosis Axis III:  Past Medical History  Diagnosis Date  . Dementia   . Hypertension   . Drug dependence   . Diverticulosis    Axis IV: housing problems, other psychosocial or environmental problems, problems related to social environment and problems with primary support group Axis V: 41-50 serious symptoms  Past Medical History:  Past Medical History  Diagnosis Date  . Dementia   . Hypertension   . Drug dependence   . Diverticulosis     Past Surgical History  Procedure Date  . Ankle surgery     Family History: History reviewed. No pertinent family history.  Social History:  reports that he has been smoking.  He does not have any smokeless tobacco history on file. He reports that he drinks alcohol. He reports that he does not use illicit  drugs.  Additional Social History:  Alcohol / Drug Use Pain Medications: See MAR Prescriptions: See MAR Over the Counter: See MAR History of alcohol / drug use?: No history of alcohol / drug abuse  CIWA: CIWA-Ar BP: 104/61 mmHg Pulse Rate: 61  COWS:    Allergies:  Allergies  Allergen Reactions  . Penicillins     Home Medications:  (Not in a hospital admission)  OB/GYN Status:  No LMP for male patient.  General Assessment Data Location of Assessment: [facility] ACT Assessment: Yes Living Arrangements: Other (Comment) ([facility]) Can pt return to current living arrangement?: No Admission Status: Involuntary Is patient capable of signing voluntary admission?: No Transfer from: Nsg Home Referral Source: MD  Education Status Is patient currently in school?: No Contact person:  Luisa Hart [place]/ Brother)  Risk to self Suicidal Ideation: No-Not Currently/Within Last 6 Months Suicidal Intent: No-Not Currently/Within Last 6 Months Is patient at risk for suicide?: No Suicidal Plan?: No-Not Currently/Within Last 6 Months Access to Means: No What has been your use of drugs/alcohol within the last 12 months?: Pt denies Previous Attempts/Gestures: Yes How many times?: 1  Other Self Harm Risks: None reported Triggers for Past Attempts: Unknown Intentional Self Injurious Behavior: None Family Suicide History: Unknown Recent stressful life event(s): Other (Comment) (Living condition) Persecutory voices/beliefs?: No Depression: No Depression Symptoms: Feeling angry/irritable Substance abuse history and/or treatment for substance abuse?: No Suicide prevention information given to non-admitted patients: Not applicable  Risk to Others Homicidal Ideation: No Thoughts of Harm to Others:  No Current Homicidal Intent: No Current Homicidal Plan: No Access to Homicidal Means: No Identified Victim: N/A History of harm to others?: Yes Assessment of Violence: On  admission Violent Behavior Description: Physical aggression towards other residents Does patient have access to weapons?: No Criminal Charges Pending?: No Does patient have a court date: No  Psychosis Hallucinations: None noted Delusions: None noted  Mental Status Report Appear/Hygiene: Disheveled Eye Contact: Poor Motor Activity: Freedom of movement;Agitation Speech: Logical/coherent Level of Consciousness: Quiet/awake;Irritable Mood: Suspicious;Irritable Affect: Blunted;Depressed;Irritable Anxiety Level: Moderate Thought Processes: Coherent Judgement: Impaired Orientation: Person;Place Obsessive Compulsive Thoughts/Behaviors: None  Cognitive Functioning Concentration: Decreased Memory: Recent Impaired;Remote Impaired IQ: Average Insight: Poor Impulse Control: Poor Appetite: Fair Weight Loss: 0  Weight Gain: 0  Sleep: No Change Total Hours of Sleep:  ("As much as I want") Vegetative Symptoms: None  ADLScreening [facility] Assessment Services) Patient's cognitive ability adequate to safely complete daily activities?: Yes Patient able to express need for assistance with ADLs?: Yes Independently performs ADLs?: Yes (appropriate for developmental age)  Abuse/Neglect [facility]) Physical Abuse: Denies Verbal Abuse: Denies Sexual Abuse: Denies  Prior Inpatient Therapy Prior Inpatient Therapy: No  Prior Outpatient Therapy Prior Outpatient Therapy: No  ADL Screening (condition at time of admission) Patient's cognitive ability adequate to safely complete daily activities?: Yes Patient able to express need for assistance with ADLs?: Yes Independently performs ADLs?: Yes (appropriate for developmental age) Weakness of Legs: None Weakness of Arms/Hands: None  Home Assistive Devices/Equipment Home Assistive Devices/Equipment: None  Therapy Consults (therapy consults require a physician order) PT Evaluation Needed: No OT Evalulation Needed: No Abuse/Neglect Assessment  (Assessment to be complete while patient is alone) Physical Abuse: Denies Verbal Abuse: Denies Sexual Abuse: Denies Exploitation of patient/patient's resources: Denies Values / Beliefs Cultural Requests During Hospitalization: None Spiritual Requests During Hospitalization: None Consults Spiritual Care Consult Needed: No Social Work Consult Needed: No   Nutrition Screen- [facility] Adult/[facility] Patient's home diet: Regular (Tray given.)  Additional Information 1:1 In Past 12 Months?: No CIRT Risk: Yes Elopement Risk: Yes Does patient have medical clearance?: Yes     Disposition: Referral for inpatient treatment.  Disposition Disposition of Patient: Inpatient treatment program Type of inpatient treatment program: Adult  On Site Evaluation by:   Reviewed with Physician:     Janann Colonel C 03/30/2012 9:47 AM

## 2012-03-31 NOTE — ED Notes (Signed)
Pt encouraged to bathe and verbally refused, however when told to follow staff into bathroom, he complied. Pt requires minimal assistance and verbal cuing to begin showering, but is able to complete on his own. Pt is incontinent of urine, and was wearing a soiled "pull-up" under his paper scrubs. Pt allowed RN to remove soiled garments before showering, and allowed RN to apply a brief afterwards. Pt is continuously asking for chips and juice, and forgets that he has recently eaten them.

## 2012-03-31 NOTE — ED Provider Notes (Signed)
Patient resting comfortably. No issues as per nursing. Pending placement.   Edgar Canal, MD 03/31/12 [phone]

## 2012-04-01 ENCOUNTER — Other Ambulatory Visit (HOSPITAL_COMMUNITY): Payer: Non-veteran care

## 2012-04-01 NOTE — Progress Notes (Signed)
Per Jessie Foot with the ACT team, Pt is ready for d/c, however Edgar Vaz[facility] will not accept Pt back.  CSW to Ax Pt for SNF placement tomorrow.  CSW to continue to follow.  Providence Crosby, LCSWA Clinical Social Work [phone]

## 2012-04-01 NOTE — ED Provider Notes (Signed)
Assuming care of patient Edgar Vazquez. Patient is awaiting inpatient psych placement. Medically cleared. Patient had no complains, no concerns from the nursing side. Will continue to monitor.  Derwood Kaplan, MD 04/01/12 1050

## 2012-04-01 NOTE — Consult Note (Signed)
Reason for Consult: Dementia, and aggression in nursing home  Referring Physician: Dr. Opal Sidles is an 66 y.o. male.  HPI: Patient was seen walking on hallway and staying in his room and watching television. He has been compliant with his medication and positively responded. He stated the he was [employer] and could not give me more details. He has no complaints today and he says he is waiting for the placement. He has no reported agitation or aggressive behaviors over the weekend. Patient is a poor historian, respond to the questions briefly and some time vaguely. Patient has been stable without irritability, agitation and aggression. He does forgetful and needs redirection from time to time. Patient has poor insight, judgment and impulse control. Patient urine drug screen was negative for drug of abuse. Patient had no prior history of self injurios behaviors. He was calm and cooperative. He has fine mood and affect. He does not appeared psychotic or talking to himself.   Past Medical History  Diagnosis Date  . Dementia   . Hypertension   . Drug dependence   . Diverticulosis     Past Surgical History  Procedure Date  . Ankle surgery     History reviewed. No pertinent family history.  Social History:  reports that he has been smoking.  He does not have any smokeless tobacco history on file. He reports that he drinks alcohol. He reports that he does not use illicit drugs.  Allergies:  Allergies  Allergen Reactions  . Penicillins     Medications: I have reviewed the patient's current medications.  No results found for this or any previous visit (from the past 48 hour(s)).  No results found.  Positive for aggressive behavior, learning difficulty and memory deficits, oppositional and occasionally defiant. Blood pressure 99/63, pulse 74, temperature 97.9 F (36.6 C), temperature source Oral, resp. rate 18, SpO2 94.00%.   Assessment/Plan: Dementia NOS Mood  disorder NOS  Recommend placement at Nursing home for long term care and contact social services referral. If needed may increase his risperidone to 1 mg twice a day.   Everlina Gotts,JANARDHAHA R. 04/01/2012, 2:58 PM

## 2012-04-01 NOTE — BHH Counselor (Signed)
Patient evaluated by psychiatrist (Dr.Jonnalaggadda) and he recommends the following:  placement at Nursing home for long term care and contact social services referral. If needed may increase his risperidone to 1 mg twice a day. Writer informed Dr. Oneta Rack that SW would have to be called to assist with placement. Writer then called SW [phone] and spoke to [place] regarding patient's disposition. Marchelle Folks agreed to assist with placement for patient.   Writer also spoke to patient's nurse-Nicole and updated her regarding patients disposition. She sts that patient was residing at [facility] and unable to return. Writer shared this information with Marchelle Folks. She sts that she will call the facility to verify if patient is able to return and/or seek alternative placement.

## 2012-04-02 NOTE — ED Notes (Signed)
Report given to [place] in [place].

## 2012-04-02 NOTE — Progress Notes (Signed)
CSW re-initiated ALF placement search now that pt no longer for inpt psych placement. ALFs reviewing updated clinicals. Unable to reach pt's brother, Patrick--will continue to attempt contact.  Dellie Burns, MSW, LCSWA [phone] (coverage)

## 2012-04-02 NOTE — ED Notes (Signed)
Pharmacy called for Ensure again. "I'll get it to you".

## 2012-04-02 NOTE — ED Provider Notes (Signed)
Patient has been in the ED since September 4. He is here with IVC papers for dementia and being verbally and physically abusive at his staff. Patient has been sleeping.  Devoria Albe, MD, FACEP   Ward Givens, MD 04/02/12 [phone]

## 2012-04-02 NOTE — ED Notes (Signed)
Pt given snack. 

## 2012-04-02 NOTE — ED Notes (Signed)
Pharmacy called regarding Ensure. "Will send ASAP".

## 2012-04-02 NOTE — ED Notes (Signed)
Pt refuses shower.  

## 2012-04-02 NOTE — Progress Notes (Signed)
Pt will need a TB skin test for ALF/residential placement. [facility] in [place] reviewing. CSW continuing to follow.  Dellie Burns, MSW, [place] [phone] (coverage)

## 2012-04-03 NOTE — Progress Notes (Signed)
Spoke with Selena at [facility].  Per Phil Dopp, the Admissions Director is the only one that makes decisions re: admissions and that this person is on vacation until Friday.  Providence Crosby, LCSWA Clinical Social Work [phone]

## 2012-04-03 NOTE — ED Notes (Signed)
Patient did eat his bacon and drank his ensure and orange juice

## 2012-04-03 NOTE — ED Provider Notes (Signed)
Pt awaiting placement by social work--vitals stable  Edgar Baker, MD 04/03/12 [phone]

## 2012-04-03 NOTE — ED Notes (Signed)
Per phone call from social worker Marchelle Folks, the the person in admission at [facility] facility will not return to the office until Friday 04/05/12.

## 2012-04-04 ENCOUNTER — Telehealth (HOSPITAL_COMMUNITY): Payer: Self-pay | Admitting: Licensed Clinical Social Worker

## 2012-04-04 ENCOUNTER — Encounter (HOSPITAL_COMMUNITY): Payer: Self-pay

## 2012-04-04 DIAGNOSIS — F39 Unspecified mood [affective] disorder: Secondary | ICD-10-CM | POA: Diagnosis present

## 2012-04-04 DIAGNOSIS — F0390 Unspecified dementia without behavioral disturbance: Secondary | ICD-10-CM | POA: Diagnosis present

## 2012-04-04 MED ORDER — PNEUMOCOCCAL VAC POLYVALENT 25 MCG/0.5ML IJ INJ
0.5000 mL | INJECTION | INTRAMUSCULAR | Status: AC
  Administered 2012-04-05: 0.5 mL via INTRAMUSCULAR
  Filled 2012-04-04 (×2): qty 0.5

## 2012-04-04 MED ORDER — RISPERIDONE 1 MG PO TABS
1.0000 mg | ORAL_TABLET | Freq: Two times a day (BID) | ORAL | Status: DC
  Administered 2012-04-04 – 2012-04-05 (×2): 1 mg via ORAL
  Filled 2012-04-04 (×2): qty 1

## 2012-04-04 MED ORDER — INFLUENZA VIRUS VACC SPLIT PF IM SUSP
0.5000 mL | INTRAMUSCULAR | Status: AC
  Administered 2012-04-05: 0.5 mL via INTRAMUSCULAR
  Filled 2012-04-04: qty 0.5

## 2012-04-04 MED ORDER — ZIPRASIDONE MESYLATE 20 MG IM SOLR
20.0000 mg | Freq: Once | INTRAMUSCULAR | Status: AC
  Administered 2012-04-04: 20 mg via INTRAMUSCULAR
  Filled 2012-04-04: qty 20

## 2012-04-04 NOTE — ED Notes (Signed)
Patient complains of constipation. Patient will be medicated per MAR.

## 2012-04-04 NOTE — ED Notes (Addendum)
Pt became increasingly agitated asking for chips and drink. Pt informed about not being able to have anything else to drink. Pt walked out of room looking for kitchen and pulling at id band. Threw Id band in trash and began calling staff inappropiate names. Pt physically tried to swat at staff helping him get back to room. Security called and present. Spoke with Dr. Read Drivers and he gave new orders. In orders implemented (see MAR). Pt asked for belongings but informed by security that he could not have them. During medication administration pt spoke with vulgar language to security. ID band replaced.

## 2012-04-04 NOTE — Consult Note (Signed)
Reason for Consult: Dementia, and history of for aggression and nursing home Referring Physician: Dr. Tedra Vazquez is an 66 y.o. male.  HPI: Patient was seen and chart reviewed for possible acute psychiatric hospitalization as per her request. Patient appeared staying in his room lying down facing towards the wall covered with sheets. Patient is awake and alert, but not oriented to place, person and situation. Patient denies being depressed, sad, anxious, and irritable, agitated, and he was not aggressive as per the staff notes. Patient has been calm and compliant with his medications. Patient stated he is waiting to be placed aware of her Hospital place him. Patient has a poor insight, judgment and impulse control. Patient had showed very limited noncooperative behavior from time to time during his stay in [facility] department since 03/27/2012. Patient was brought in by [facility] police department with the involuntary commitment papers from an [facility] nursing facility with the history of increased verbal aggressive behavior towards the facility staff and other residents for about 2-3 weeks. Patient was treated for aggressive behaviors with antipsychotic medication during his stay. Patient was in the nursing home from 01/23/2012 to March 27, 2012.  Past Medical History  Diagnosis Date  . Dementia   . Hypertension   . Drug dependence   . Diverticulosis     Past Surgical History  Procedure Date  . Ankle surgery     History reviewed. No pertinent family history.  Social History:  reports that he has been smoking Cigarettes.  He has never used smokeless tobacco. He reports that he drinks alcohol. He reports that he does not use illicit drugs.  Allergies:  Allergies  Allergen Reactions  . Penicillins     Medications: I have reviewed the patient's current medications.  No results found for this or any previous visit (from the past 48 hour(s)).  No results  found.  Positive for aggressive behavior and dementia Blood pressure 107/72, pulse 82, temperature 98.1 F (36.7 C), temperature source Oral, resp. rate 16, SpO2 95.00%.   Assessment/Plan: Increase Risperidone 1 mg i PO BID for agitation. Patient does not meet criteria for acute psychiatric hospitalization. Patient needed assisted living facility / placement for long-term care.  Edgar Vazquez,Edgar Vazquez. 04/04/2012, 2:27 PM

## 2012-04-04 NOTE — Progress Notes (Signed)
Per Jessie Foot, ACT, Pt to be admitted to [facility] by MD Bernette Mayers.  LM for Dr. Jacky Kindle apprising him of the situation.  Providence Crosby, LCSWA Clinical Social Work [phone]

## 2012-04-04 NOTE — ED Notes (Signed)
Pt tried to walk out of psych ed and push on doors out.  Pt redirected to room.  Pt turned off light and is sleeping at present.

## 2012-04-04 NOTE — ED Notes (Signed)
Bed:WHALA<BR> Expected date:<BR> Expected time:<BR> Means of arrival:<BR> Comments:<BR> Pt in Rm. 43

## 2012-04-04 NOTE — Progress Notes (Signed)
[facility] ED CM reviewed EPIC notes after speaking with [facility] unit [facility] CM.  Pt did not meet inpatient criteria.  CM agree with Isaiah Serge ED charge RN.  CM supervisor aware

## 2012-04-04 NOTE — ED Notes (Signed)
Pt to move back to Psych ED per [facility], Consulting civil engineer, report called to Cyndia Diver, Charity fundraiser.

## 2012-04-04 NOTE — ED Notes (Signed)
Per Isac Caddy, pt does not meet medical admission at this time, bed assigned to pt at [facility] is cancelled.  She reports that there is a possibility that pt will be held in the ED until tomorrow to go to [place].

## 2012-04-04 NOTE — ED Notes (Signed)
Pt ambulated to the stretcher bed without difficulty, started pt's IV, pt cooperative but keeps cursing.  Pt asked to stop cursing.  Pt also asked this nurse same questions 3 times re where he is going and what it is for.  Pt states that there's nothing he could do about where he goes.  Pt  Does not remember where he used to stay.  Pt became loud and was cursing when this nurse started his IV but was able to finish enforcing the IV without any problems.

## 2012-04-04 NOTE — ED Notes (Signed)
Pt ambulated back to Psych ED, 42, accompanied by this nurse with chart and personal belongings.

## 2012-04-04 NOTE — Progress Notes (Signed)
[facility] ED CM consulted by [facility] unit CM about reason for pt admission to [facility].  [facility] ED CM spoke with [facility] hospitalist staff, K Black who referred CMs to Dr Rito Ehrlich.  CM updated [facility] unit CM

## 2012-04-04 NOTE — ED Notes (Signed)
Pt is agitated and repeatedly coming to the nurse's station.  Pt is hostile and uncooperative at this time. Will continue to monitor patient and behavior. Pt moved to Psyh ED; charge nurse notified.

## 2012-04-04 NOTE — ED Notes (Signed)
Admitting MD spoke with Dr. Bernette Mayers, pt to stay at [facility] until placement at [facility].

## 2012-04-04 NOTE — ED Notes (Signed)
Gave pt coke and crackers.  Showed pt how to use tv.

## 2012-04-04 NOTE — ED Notes (Signed)
Spoke with bed placement, pt to be admitted at [facility], awaiting bed placement per [facility], will monitor.

## 2012-04-04 NOTE — Progress Notes (Signed)
Edgar Vazquez, is a 66 y.o. male,   MRN: [id number]  -  DOB - Aug 20, 1945  Outpatient Primary MD for the patient is MARTIN,NYKEDTRA, NP  in for    Chief Complaint  Patient presents with  . Medical Clearance     Blood pressure 116/72, pulse 84, temperature 97.5 F (36.4 C), temperature source Oral, resp. rate 20, SpO2 97.00%.  Active Problems:  MALNUTRITION, PROTEIN-CALORIE  ALCOHOL ABUSE  HYPERTENSION, BENIGN ESSENTIAL  CARDIOMYOPATHY, ALCOHOLIC  Dementia  Mood disorder      66 yo hx as above  Brought to [facility] ED 03/27/12 by [facility] police department with the involuntary commitment petition by his brother from  [facility], where he has been staying since 01/23/2012. Reportedly patient brother has been receiving multiple calls from the nursing facility regarding his increased physical and verbal agitation and aggressive behavior towards other tenants.   In ED has been seen by psych who have cleared him for placement. SW unable to place and currently awaiting approval to a facility where the admitting person is on vacation until 04/05/12.   It has been determined that he will likely benefit from inpatient status vs waiting in ED.   Pt will be transferred to [facility] per Dr. Rito Ehrlich.  Admit to medical bed.

## 2012-04-05 LAB — POCT I-STAT, CHEM 8
Creatinine, Ser: 0.7 mg/dL (ref 0.50–1.35)
Glucose, Bld: 124 mg/dL — ABNORMAL HIGH (ref 70–99)
Hemoglobin: 13.6 g/dL (ref 13.0–17.0)
Potassium: 3.8 mEq/L (ref 3.5–5.1)
TCO2: 26 mmol/L (ref 0–100)

## 2012-04-05 NOTE — Progress Notes (Signed)
Confirmed receipt of fax by Admissions social worker.  Social worker to review and contact CSW with disposition.  Notified by Elnita Maxwell, Admissions Director at [facility], that the facility cannot accept Pt.  CSW thanked [place] for her time.  Updated Supervisor, [facility], via text.  CSW to continue to follow.  Providence Crosby, LCSWA Clinical Social Work [phone]

## 2012-04-05 NOTE — Progress Notes (Signed)
Spoke with [employer], Insurance claims handler at [facility] in [place].  Sherril to review Pt's information and contact CSW back with a disposition.  CSW to continue to follow.  Providence Crosby, LCSWA Clinical Social Work [phone]

## 2012-04-05 NOTE — ED Notes (Signed)
Box of clothes placed in activity room-watch sent home with brother

## 2012-04-05 NOTE — ED Notes (Signed)
Report received-airway intact-no s/s's of distress-will continue to monitor 

## 2012-04-05 NOTE — Progress Notes (Signed)
LM for Edgar Vazquez, [employer], stating that CSW will be leaving soon and asked that [name] call CSW back so that CSW can arrange for transportation for Pt to [place].  Provided RN with Jackie's contact information, as well as the number to call to arrange for [facility] transport, should there be no contact with Edgar Vazquez before CSW leaves.  Providence Crosby, LCSWA Clinical Social Work [phone]

## 2012-04-05 NOTE — Progress Notes (Signed)
Per Annice Pih at [facility], ok to arrange for transportation for Pt, as facility ready to receive Pt.  Arranged for transportation via [facility] transport.  Notified [place], Supervisor, Engineer, manufacturing systems, Chiropodist, Charity fundraiser and Pt's brother.  Pt to be d/c'd.  Providence Crosby, LCSWA Clinical Social Work [phone]

## 2012-04-05 NOTE — ED Provider Notes (Signed)
Pt stable awaiting placement  Benny Lennert, MD 04/05/12 0700

## 2012-04-05 NOTE — ED Notes (Signed)
Report given to [facility] at [facility] in [place] for transport

## 2012-04-05 NOTE — Progress Notes (Signed)
[place] requesting update labs.  Notified MD.  CSW to continue to follow.  Providence Crosby, LCSWA Clinical Social Work [phone]

## 2012-04-05 NOTE — Progress Notes (Signed)
Per request, faxed update information on Pt, as well as previous notes, to [place], Insurance claims handler at [facility] for her review.  Elnita Maxwell to review these and call CSW back.  Notified Clinical Social Work Interior and spatial designer, [employer].  Providence Crosby, LCSWA Clinical Social Work [phone]

## 2012-04-05 NOTE — Progress Notes (Signed)
T/c from [place], [place] social worker, stating that Pt has been accepted.   Annice Pih requesting IVC paperwork.  IVC paperwork expired on the 10th.  CSW drew up IVC paperwork, obtained MD's signature, alerted Magistrate to impending fax and confirmed with Magistrate receipt of fax.  Pt to be served.  Notified Annice Pih at [facility] that IVC papers have been drawn up and that Pt will be served.  [place]acking requesting IVC papers, once Pt has been served.  CSW to continue to follow.  Providence Crosby, LCSWA Clinical Social Work [phone]

## 2012-04-05 NOTE — Progress Notes (Signed)
Apprised Pt's brother of the situation.  Pt's brother to bring Pt's clothing to the ED today.  Notified ED staff.  Providence Crosby, LCSWA Clinical Social Work [phone]

## 2012-04-05 NOTE — Progress Notes (Signed)
Pt served.  Faxed legal papers and updated labs.  Confirmed receipt of documents by Annice Pih at [facility].  Annice Pih states that MD is reviewing papers.  Annice Pih to contact CSW when ok to send Pt.  CSW thanked Tajique for her time.  Updated RN.  CSW to continue to follow.  Providence Crosby, LCSWA Clinical Social Work [phone]

## 2012-04-05 NOTE — Progress Notes (Signed)
Spoke with Elnita Maxwell at [facility].    Answered questions about information faxed to her.  Elnita Maxwell stating that it's not likely that facility will be able to accommodate Pt but that she will review Pt's information with her supervisor for a final decision.  Notified supervisor, [facility].  Supervisor instructing CSW to make a referral to [facility].   Supervisor instructing CSW to seek assistance from RN supervisor in obtaining an emergency psych consult.  Notified Environmental education officer, Eunice Blase.  Faxed Pt's information to [facility].  Informed that they may have a bed available today, should they accept Pt.  CSW to continue to follow.  Providence Crosby, LCSWA Clinical Social Work [phone]

## 2012-04-05 NOTE — Progress Notes (Signed)
Pt's brother, Ayden Hardwick, presented to the ED to bring Pt's belongings.  Pt's brother requesting to speak with CSW.  Met with Pt's brother to discuss d/c plans.  Pt's brother discussed concerns he had re: [facility] and their care of Pt.  Additionally, Pt's brother expressed concerns re: Pt's level of aggression while Pt was at [facility]  Pt's brother concerned about Pt getting the proper tx, including medication, so that his brother can get well and move on with his life.  Pt's brother aware of d/c plans and ok with arrangements made at [place].  CSW thanked Pt's brother for his time.  Providence Crosby, LCSWA Clinical Social Work [phone]

## 2012-04-05 NOTE — ED Notes (Signed)
Left message for social worker in regards to placement/care plan update

## 2012-05-20 NOTE — ED Notes (Signed)
Got pt.'s home phone number from chart, attempted to call this number, number has been disconnected. Pt. Belongings bag taken to social work ofc.

## 2012-08-29 ENCOUNTER — Emergency Department: Payer: Self-pay | Admitting: Emergency Medicine

## 2012-12-04 ENCOUNTER — Emergency Department: Payer: Self-pay | Admitting: Emergency Medicine

## 2012-12-04 LAB — COMPREHENSIVE METABOLIC PANEL
Albumin: 3.4 g/dL (ref 3.4–5.0)
Alkaline Phosphatase: 71 U/L (ref 50–136)
Anion Gap: 6 — ABNORMAL LOW (ref 7–16)
BUN: 13 mg/dL (ref 7–18)
Bilirubin,Total: 0.6 mg/dL (ref 0.2–1.0)
Calcium, Total: 9 mg/dL (ref 8.5–10.1)
Chloride: 106 mmol/L (ref 98–107)
Co2: 28 mmol/L (ref 21–32)
Creatinine: 1.01 mg/dL (ref 0.60–1.30)
EGFR (African American): >60
EGFR (Non-African Amer.): >60
Glucose: 138 mg/dL — ABNORMAL HIGH (ref 65–99)
Osmolality: 282 (ref 275–301)
Potassium: 3.6 mmol/L (ref 3.5–5.1)
SGOT(AST): 12 U/L — ABNORMAL LOW (ref 15–37)
SGPT (ALT): 13 U/L (ref 12–78)
Sodium: 140 mmol/L (ref 136–145)
Total Protein: 7 g/dL (ref 6.4–8.2)

## 2012-12-04 LAB — URINALYSIS, COMPLETE
Bacteria: NONE SEEN
Bilirubin,UR: NEGATIVE
Blood: NEGATIVE
Glucose,UR: NEGATIVE mg/dL (ref 0–75)
Hyaline Cast: 8
Ketone: NEGATIVE
Leukocyte Esterase: NEGATIVE
Nitrite: NEGATIVE
Ph: 5 (ref 4.5–8.0)
Protein: NEGATIVE
RBC,UR: 1 /HPF (ref 0–5)
Specific Gravity: 1.024 (ref 1.003–1.030)
Squamous Epithelial: NONE SEEN
WBC UR: 1 /HPF (ref 0–5)

## 2012-12-04 LAB — CBC
HCT: 41.2 % (ref 40.0–52.0)
HGB: 14.3 g/dL (ref 13.0–18.0)
MCH: 31.2 pg (ref 26.0–34.0)
MCHC: 34.7 g/dL (ref 32.0–36.0)
Platelet: 176 10*3/uL (ref 150–440)
RBC: 4.58 10*6/uL (ref 4.40–5.90)
WBC: 6.1 10*3/uL (ref 3.8–10.6)

## 2012-12-04 LAB — TROPONIN I: Troponin-I: <0.02 ng/mL

## 2012-12-04 LAB — ETHANOL: Ethanol %: <0.003 % (ref 0.000–0.080)

## 2012-12-10 LAB — CULTURE, BLOOD (SINGLE)

## 2013-12-27 ENCOUNTER — Emergency Department: Payer: Self-pay | Admitting: Emergency Medicine

## 2015-03-31 ENCOUNTER — Emergency Department
Admission: EM | Admit: 2015-03-31 | Discharge: 2015-03-31 | Disposition: A | Discharge status: Home / Self Care | Payer: Medicare Other | Attending: Emergency Medicine | Admitting: Emergency Medicine

## 2015-03-31 ENCOUNTER — Emergency Department: Payer: Medicare Other

## 2015-03-31 DIAGNOSIS — Z880 Allergy status to penicillin: Secondary | ICD-10-CM | POA: Diagnosis not present

## 2015-03-31 DIAGNOSIS — T17320A Food in larynx causing asphyxiation, initial encounter: Secondary | ICD-10-CM | POA: Diagnosis not present

## 2015-03-31 DIAGNOSIS — Y9389 Activity, other specified: Secondary | ICD-10-CM | POA: Diagnosis not present

## 2015-03-31 DIAGNOSIS — F0390 Unspecified dementia without behavioral disturbance: Secondary | ICD-10-CM | POA: Insufficient documentation

## 2015-03-31 DIAGNOSIS — Y998 Other external cause status: Secondary | ICD-10-CM | POA: Diagnosis not present

## 2015-03-31 DIAGNOSIS — Z720 Tobacco use: Secondary | ICD-10-CM | POA: Diagnosis not present

## 2015-03-31 DIAGNOSIS — Y9289 Other specified places as the place of occurrence of the external cause: Secondary | ICD-10-CM | POA: Diagnosis not present

## 2015-03-31 DIAGNOSIS — I10 Essential (primary) hypertension: Secondary | ICD-10-CM | POA: Insufficient documentation

## 2015-03-31 DIAGNOSIS — X58XXXA Exposure to other specified factors, initial encounter: Secondary | ICD-10-CM | POA: Diagnosis not present

## 2015-03-31 DIAGNOSIS — R0989 Other specified symptoms and signs involving the circulatory and respiratory systems: Secondary | ICD-10-CM | POA: Diagnosis present

## 2015-03-31 MED ORDER — LIDOCAINE VISCOUS 2 % MT SOLN: 15.0000 mL | Freq: Once | OROMUCOSAL | Status: DC

## 2015-03-31 MED ORDER — GLUCAGON HCL (RDNA) 1 MG IJ SOLR: 1.0000 mg | Freq: Once | INTRAMUSCULAR | Status: DC

## 2015-03-31 NOTE — ED Notes (Signed)
Patient was eating supper and got choked on food. Patient states he choked it up and spit it out but feels like something is in throat.

## 2015-03-31 NOTE — ED Provider Notes (Signed)
[facility] Emergency Department Provider Note   ____________________________________________  Time seen: 6:00 PM I have reviewed the triage vital signs and the triage nursing note.  HISTORY  Chief Complaint Choking   Historian Patient  HPI Edgar Vazquez is a 69 y.o. male on EMS was called due to choking episode at his assisted living. This was witnessed on food, and a staff member performed a Heimlich. It was reported that some food did come back out. The patient is still complaining of foreign body sensation at the base of his throat. He states the mild pain. He's having hoarseness. No trouble breathing at this point in time. He is reporting some pain when he tries to swallow, however he was able to swallow water in the ambulance. Symptoms are initially severe and now mild.No chest pain.    Past Medical History  Diagnosis Date  . Dementia   . Hypertension   . Drug dependence   . Diverticulosis     Patient Active Problem List   Diagnosis Date Noted  . Dementia 04/04/2012  . Mood disorder 04/04/2012  . Pulmonary nodules 01/21/2012  . Polysubstance abuse 01/16/2012    Class: Chronic  . HYPERTENSION, BENIGN ESSENTIAL 11/19/2007  . WEAKNESS 11/19/2007  . 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 HYPONATREMIA 05/30/2007  . THROMBOCYTOSIS 05/30/2007  . ALCOHOL ABUSE 05/30/2007  . TOBACCO ABUSE 05/30/2007  . CARDIOMYOPATHY, ALCOHOLIC 05/30/2007  . DEGENERATIVE DISC DISEASE 05/30/2007    Past Surgical History  Procedure Laterality Date  . Ankle surgery      No current outpatient prescriptions on file.  Allergies Penicillins  No family history on file.  Social History Social History  Substance Use Topics  . Smoking status: Current Every Day Smoker    Types: Cigarettes  . Smokeless tobacco: Never Used  . Alcohol Use: No     Comment: occasional    Review of Systems  Constitutional: Negative for fever. Eyes: Negative for visual  changes. ENT: Positive for sore throat. Cardiovascular: Negative for chest pain. Respiratory: Negative for shortness of breath. Gastrointestinal: Negative for abdominal pain, vomiting and diarrhea. Genitourinary: Negative for dysuria. Musculoskeletal: Negative for back pain. Skin: Negative for rash. Neurological: Negative for headache. 10 point Review of Systems otherwise negative ____________________________________________   PHYSICAL EXAM:  VITAL SIGNS: ED Triage Vitals  Enc Vitals Group     BP --      Pulse --      Resp --      Temp --      Temp src --      SpO2 --      Weight --      Height --      Head Cir --      Peak Flow --      Pain Score --      Pain Loc --      Pain Edu? --      Excl. in GC? --      Constitutional: Alert and cooperative. Well appearing and in no distress. Eyes: Conjunctivae are normal. PERRL. Normal extraocular movements. ENT   Head: Normocephalic and atraumatic.   Nose: No congestion/rhinnorhea.   Mouth/Throat: Mucous membranes are moist. No visualized foreign body in the visualized portion of the oropharynx. Hoarse voice.   Neck: No stridor. Cardiovascular/Chest: Normal rate, regular rhythm.  No murmurs, rubs, or gallops. Respiratory: Normal respiratory effort without tachypnea nor retractions. Breath sounds are clear and equal bilaterally. No wheezes/rales/rhonchi. Gastrointestinal: Soft. No distention, no guarding,  no rebound. Nontender   Genitourinary/rectal:Deferred Musculoskeletal: Nontender with normal range of motion in all extremities. No joint effusions.  No lower extremity tenderness.  No edema. Neurologic:  Poor historian. Underlying baseline dementia. No gross or focal neurologic deficits are appreciated. Skin:  Skin is warm, dry and intact. No rash noted.   ____________________________________________   EKG I, Governor Rooks, MD, the attending physician have personally viewed and interpreted all ECGs.  No EKG  performed ____________________________________________  LABS (pertinent positives/negatives)  None  ____________________________________________  RADIOLOGY All Xrays were viewed by me. Imaging interpreted by Radiologist.  Chest x-ray 1 view: Negative Soft tissue neck: Negative __________________________________________  PROCEDURES  Procedure(s) performed: None  Critical Care performed: None  ____________________________________________   ED COURSE / ASSESSMENT AND PLAN  CONSULTATIONS: None  Pertinent labs & imaging results that were available during my care of the patient were reviewed by me and considered in my medical decision making (see chart for details).  The patient does have some hoarseness, however he is otherwise talking swallowing and breathing fine. Initially was complaining of some foreign body sensation at the base of his throat, however by 645 he states that his throat feels fine and there is no longer any pain. He is able to drink without any problem.  I do not suspect any retained foreign body in the airway or the esophagus.  Patient / Family / Caregiver informed of clinical course, medical decision-making process, and agree with plan.   I discussed return precautions, follow-up instructions, and discharged instructions with patient and/or family.  ___________________________________________   FINAL CLINICAL IMPRESSION(S) / ED DIAGNOSES   Final diagnoses:  Choking due to food in larynx, initial encounter       Governor Rooks, MD 03/31/15 1850

## 2015-03-31 NOTE — ED Notes (Signed)
Called [facility] in [place].   Talked with Archie Endo, supervisor in charge.    She was given a report and d/c instructions.   She states to call [facility] because they have and account and they should come get patient.

## 2015-03-31 NOTE — Discharge Instructions (Signed)
You were evaluated after choking episode, and you are now breathing and swallowing okay. The hoarseness will probably ease off over the next 1-2 days and is due to irritation during the choking episode.  Return to the emergency department for any worsening condition including any trouble breathing, wheezing, trouble swallowing, or any other symptoms concerning to you.   Choking Choking occurs when a food or object gets stuck in the throat or trachea, blocking the airway. If the airway is partly blocked, coughing will usually cause the food or object to come out. If the airway is completely blocked, immediate action is needed to help it come out. A complete airway blockage is life threatening because it causes breathing to stop. Choking is a true medical emergency that requires fast, appropriate action by anyone available. SIGNS OF AIRWAY BLOCKAGE There is a partial airway blockage if you or the person who is choking is:   Able to breathe and speak.  Coughing loudly.  Making loud noises. There is a complete airway blockage if you or the person who is choking is:   Unable to breathe.  Making soft or high-pitched sounds while breathing.  Unable to cough or coughing weakly, ineffectively, or silently.  Unable to cry, speak, or make sounds.  Turning blue.  Holding the neck with both arms. This is the universal sign of choking. WHAT TO DO IF CHOKING OCCURS If there is a partial airway blockage, allow coughing to clear the airway. Do not try to drink until the food or object comes out. If someone else has a partial airway blockage, do not interfere. Stay with him or her and watch for signs of complete airway blockage until the food or object comes out.  If there is a complete airway blockage or if there is a partial airway blockage and the food or object does not come out, perform abdominal thrusts (also referred to as the Heimlich maneuver). Abdominal thrusts are used to create an artificial  cough to try to clear the airway. Performing abdominal thrusts is part of a series of steps that should be done to help someone who is choking. Abdominal thrusts are usually done by someone else, but if you are alone, you can perform abdominal thrusts on yourself. Follow the procedure below that best fits your situation.  IF SOMEONE ELSE IS CHOKING: For a conscious adult:  1. Ask the person whether he or she is choking. If the person nods, continue to step 2. 2. Stand or kneel behind the person and lean him or her forward slightly. 3. Make a fist with 1 hand, put your arms around the person, and grasp your fist with your other hand. Place the thumb side of your fist in the person's abdomen, just below the ribs. 4. Press inward and upward with both hands. 5. Repeat this maneuver until the object comes out and the person is able to breathe or until the person loses consciousness. For an unconscious adult: 1. Shout for help. If someone responds, have him or her call local emergency services (911 in [place] If no one responds, call local emergency services yourself if possible. 2. Begin CPR, starting with compressions. Every time you open the airway to give rescue breaths, open the person's mouth. If you can see the food or object and it can be easily pulled out, remove it with your fingers. 3. After 5 cycles or 2 minutes of CPR, call local emergency services (911 in [place] if you or someone else did not  already call. For a conscious adult who is obese or in the later stages of pregnancy: Abdominal thrusts may not be effective when helping people who are in the later stages of pregnancy or who are obese. In these instances, chest thrusts can be used.  1. Ask the person whether he or she is choking. If the person nods and has signs of complete airway blockage, continue to step 2. 2. Stand behind the person and wrap your arms around his or her chest (with your arms under the person's armpits). 3. Make a fist  with 1 hand. Place the thumb side of your fist on the middle of the person's breastbone. 4. Grab your fist with your other hand and thrust backward. Continue this until the object comes out or until the person becomes unconscious. For an unconscious adult who is obese or in the later stages of pregnancy:  1. Shout for help. If someone responds, have him or her call local emergency services (911 in [place] If no one responds, call local emergency services yourself if possible. 2. Begin CPR, starting with compressions. Every time you open the airway to give rescue breaths, open the person's mouth. If you can see the food or object and it can be easily pulled out, remove it with your fingers. 3. After 5 cycles or 2 minutes of CPR, call local emergency services (911 in [place] if you or someone else did not already call. Note that abdominal thrusts (below the rib cage) should be used for a pregnant woman if possible. This should be possible until the later stages of pregnancy when there is no longer enough room between the enlarging uterus and the rib cage to perform the maneuver. At that point, chest thrusts must be used as described. IF YOU ARE CHOKING: 1. Call local emergency services (911 in [place] if near a landline. Do not worry about communicating what is happening. Do not hang up the phone. Someone may be sent to help you anyway. 2. Make a fist with 1 hand. Put the thumb side of the fist against your stomach, just above the belly button and well below the breastbone. If you are pregnant or obese, put your fist on your chest instead, just below the breastbone and just above your lowest ribs. 3. Hold your fist with your other hand and bend over a hard surface, such as a table or chair. 4. Forcefully push your fist in and up. 5. Continue to do this until the food or object comes out. PREVENTION  To be prepared if choking occurs, learn how to correctly perform abdominal thrusts and give CPR by taking a  certified first-aid training course.  SEEK IMMEDIATE MEDICAL CARE IF:  You have a fever after choking stops.  You have problems breathing after choking stops.  You received the Heimlich maneuver. MAKE SURE YOU:  Understand these instructions.  Watch your condition.  Get help right away if you are not doing well or get worse. Document Released: 08/17/2004 Document Revised: 11/24/2013 Document Reviewed: 02/20/2012 [facility] Patient Information 2015 [place], [place]. This information is not intended to replace advice given to you by your health care provider. Make sure you discuss any questions you have with your health care provider.

## 2015-03-31 NOTE — ED Notes (Signed)
Patient with no complaints at this time. Respirations even and unlabored. Skin warm/dry. Discharge instructions reviewed with patient at this time. Patient given opportunity to voice concerns/ask questions. Patient discharged at this time and left Emergency Department, via wheelchair.   

## 2015-04-22 IMAGING — CT CT HEAD WITHOUT CONTRAST
1 series · 15 of 30 positions shown, 19 images · non-contrast
Comparison: none

REASON FOR EXAM: altered mental status following trauma
COMMENTS:   May transport without cardiac monitor

[Series 2: soft tissue · axial · 0.46mm/px · z∈[+1252,+1386]mm · 15 of 31 slices shown, 19 images]
[im 2/31  brain]
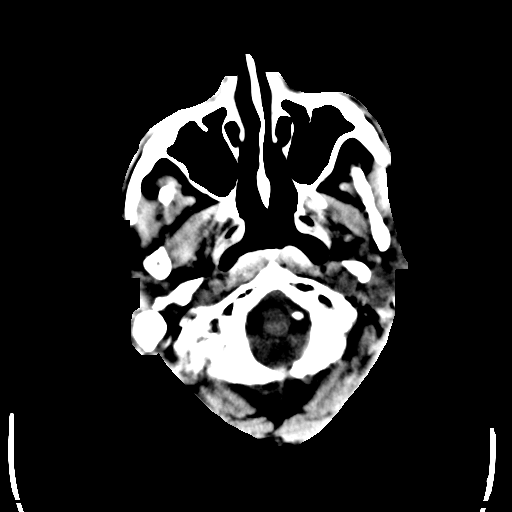
[im 2/31  bone]
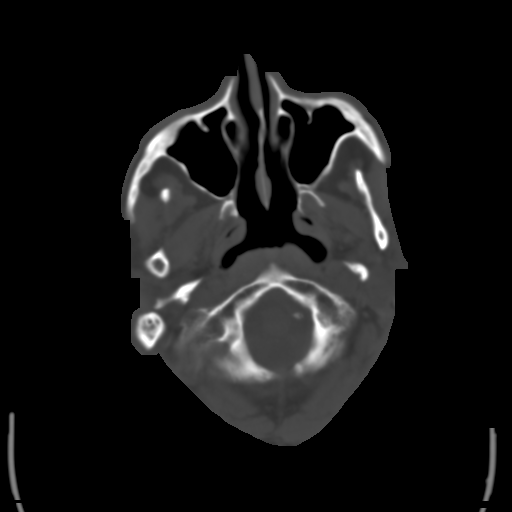
[im 4/31  brain]
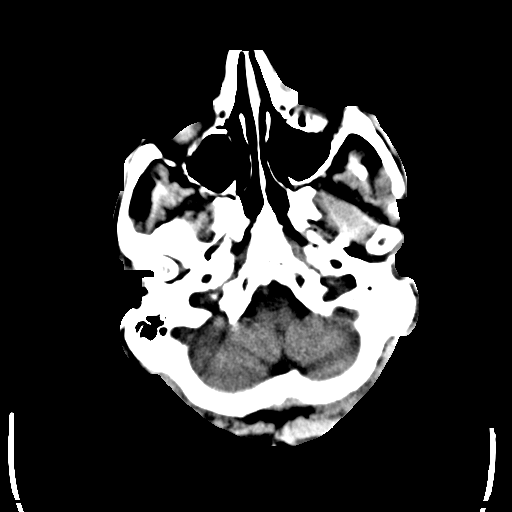
[im 6/31  brain]
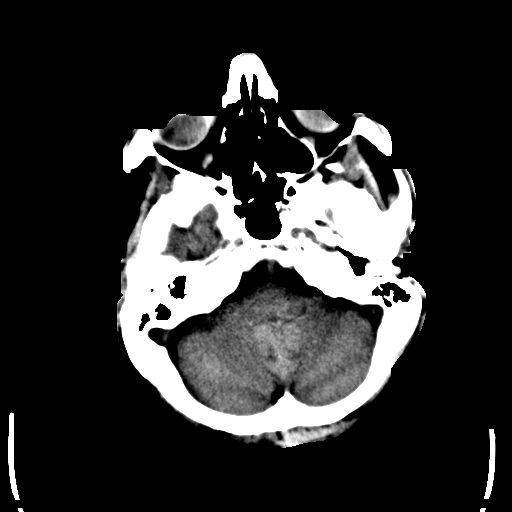
[im 8/31  brain]
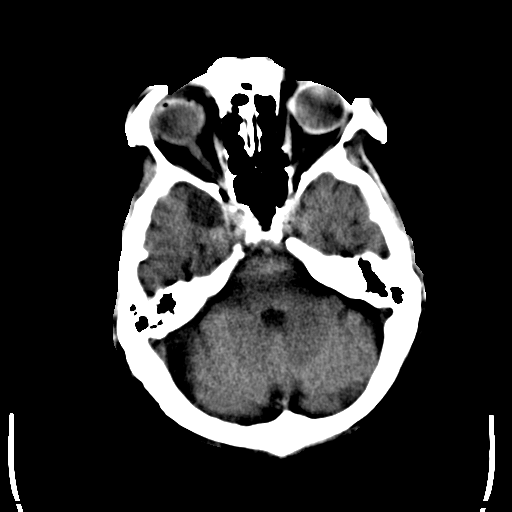
[im 10/31  brain]
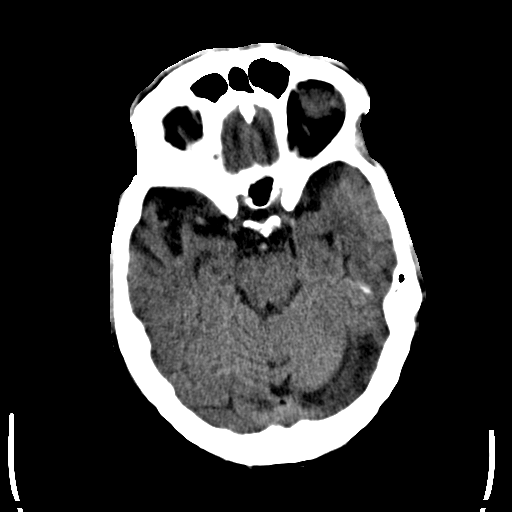
[im 10/31  bone]
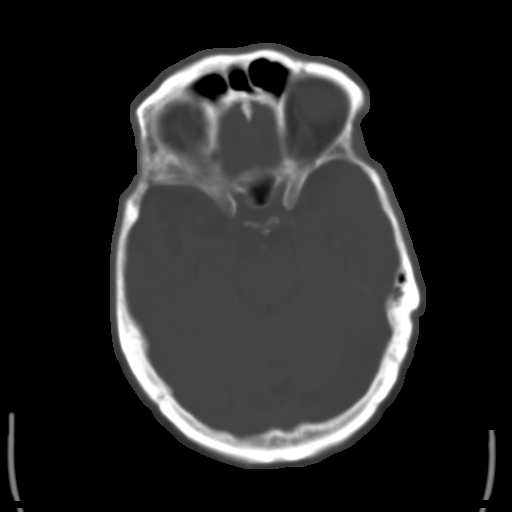
[im 12/31  brain]
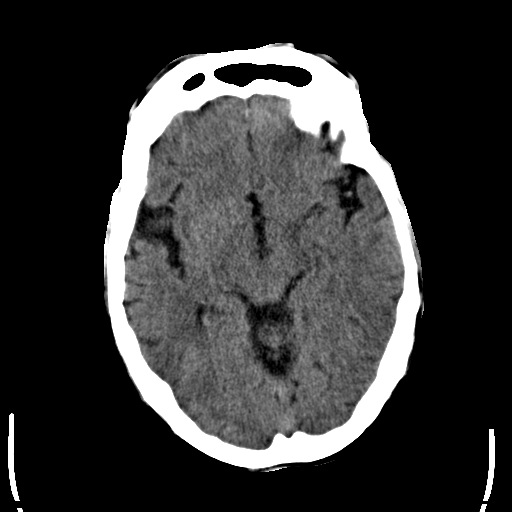
[im 14/31  brain]
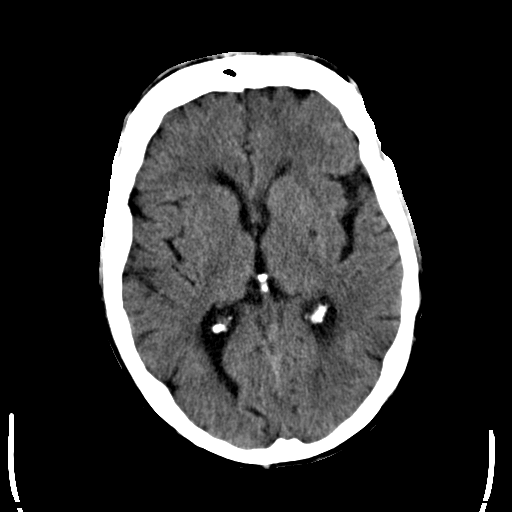
[im 16/31  brain]
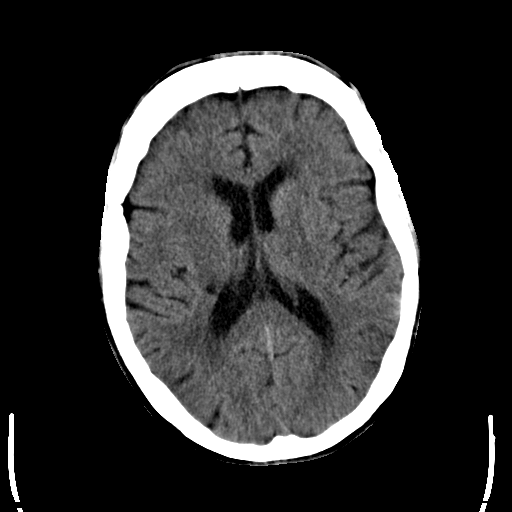
[im 17/31  brain]
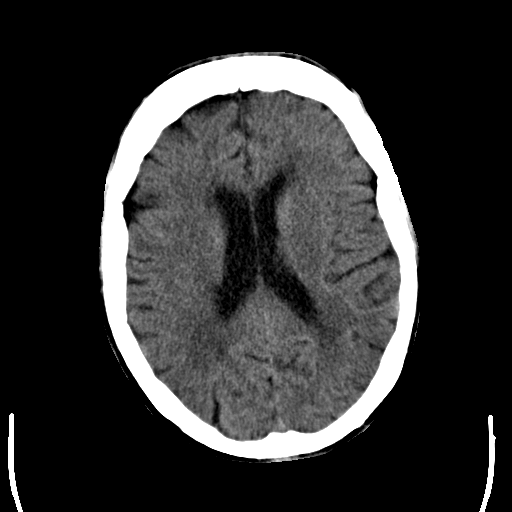
[im 17/31  bone]
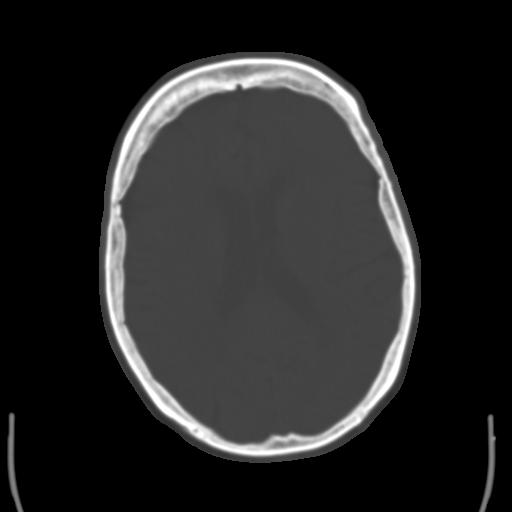
[im 19/31  brain]
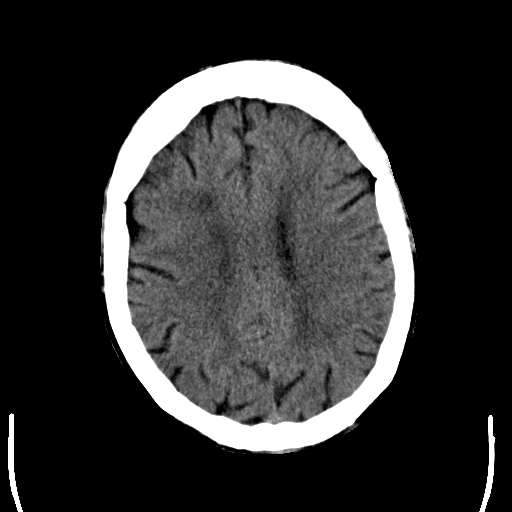
[im 21/31  brain]
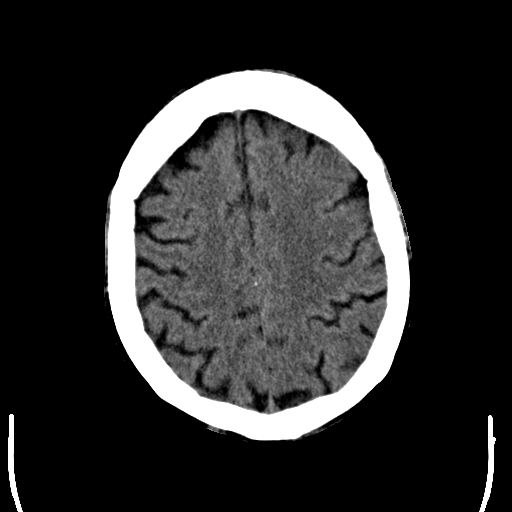
[im 23/31  brain]
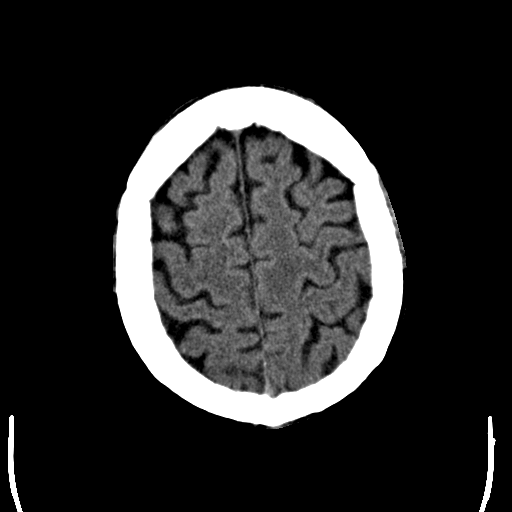
[im 25/31  brain]
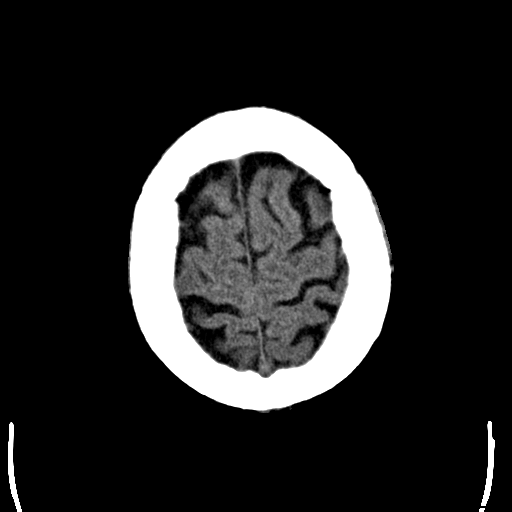
[im 25/31  bone]
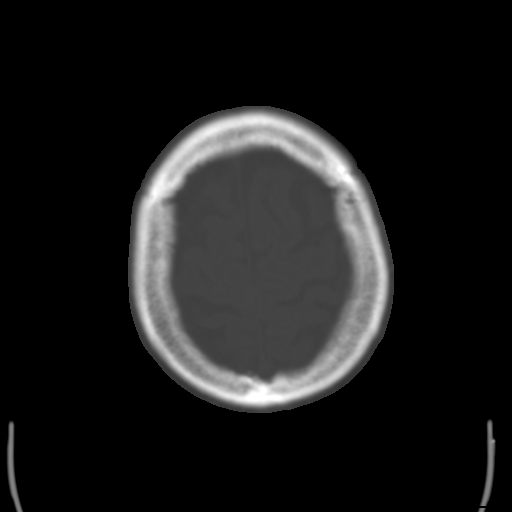
[im 27/31  brain]
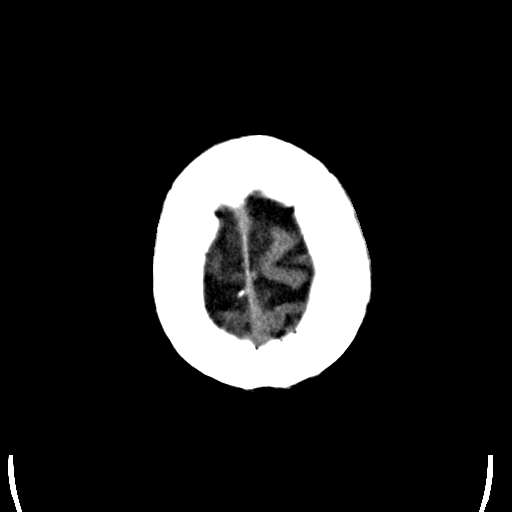
[im 29/31  brain]
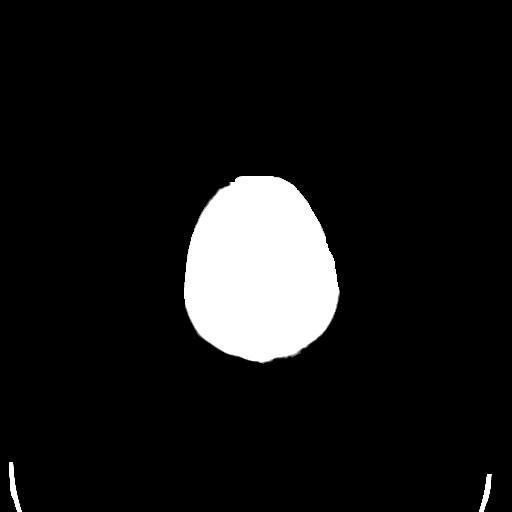

[15 of 30 positions shown; findings below may reference images not displayed]

PROCEDURE:     CT  - CT HEAD WITHOUT CONTRAST  - December 04, 2012  [DATE]

RESULT:     Axial noncontrast CT scanning was performed through the brain
with reconstructions at 5 mm intervals and slice thicknesses.

The ventricles are normal in size and position. There is subtle decreased
density in the deep white matter of both cerebral hemispheres consistent
with chronic small vessel ischemic type change. There is a subcentimeter
hypodensity in the periphery of the left basal ganglia as well as in the
right posterior pericallosal parenchyma consistent with previous lacunar
infarctions. There is no intracranial hemorrhage nor intracranial mass
effect or evidence of an evolving ischemic infarction. The cerebellum and
brainstem are normal in density.

At bone window settings the observed portions of the paranasal sinuses
mastoid air cells are clear. There is no evidence of an acute skull
fracture. There is irregularity of the soft tissues over the forehead
consistent with soft tissue injury.
IMPRESSION: 1. There is no evidence of an acute intracranial hemorrhage nor other acute
abnormality of the brain.
2. There are are findings consistent with chronic small vessel ischemic type
change with old lacunar infarctions bilaterally. Is there a history of
long-standing hypertension?
3. There is no evidence of an acute skull fracture.

[REDACTED]

## 2015-04-22 IMAGING — CR DG CHEST 1V PORT
1 series · 1 of 1 positions shown · non-contrast
Comparison: none

REASON FOR EXAM: altered mental statu
COMMENTS:

PROCEDURE:     DXR - DXR PORTABLE CHEST SINGLE VIEW  - December 04, 2012  [DATE]
RESULT:     The lungs are clear. The cardiac silhouette and visualized bony
skeleton are unremarkable.

[ap]
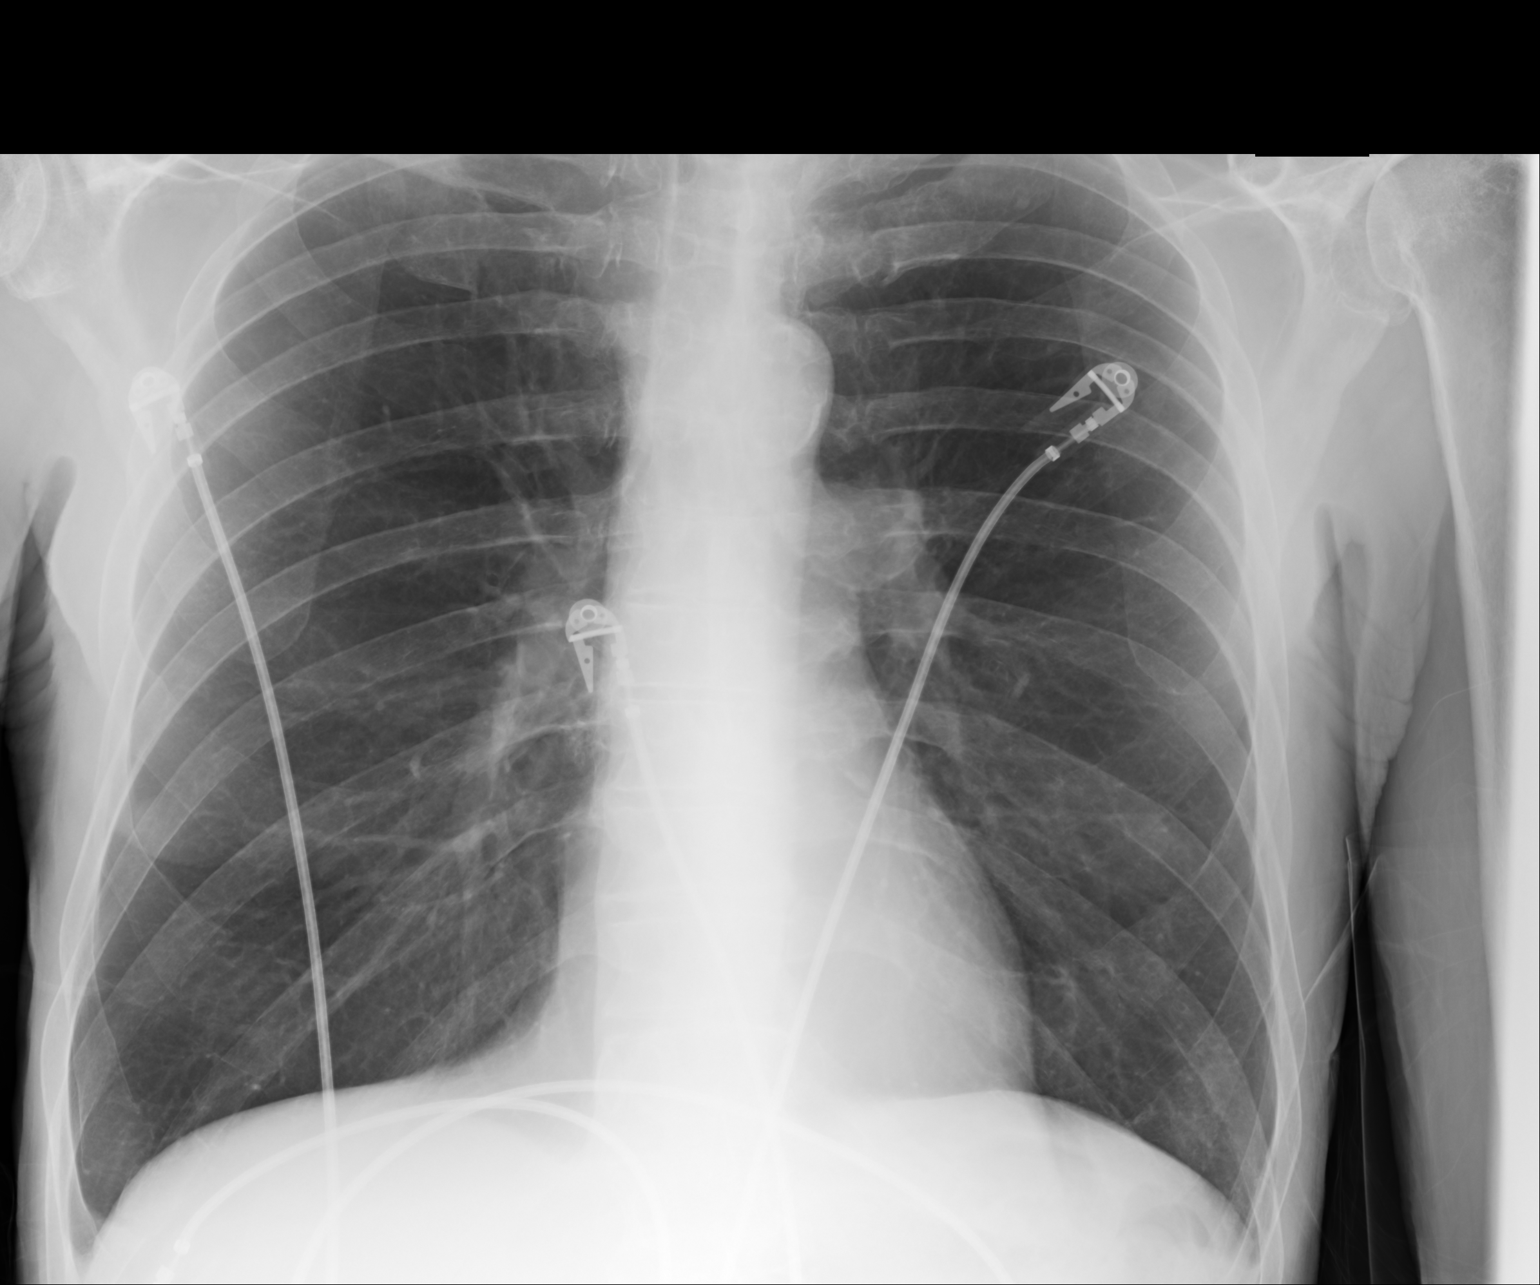

[1 of 1 positions shown; findings below may reference images not displayed]

IMPRESSION: 1. Chest radiograph without evidence of acute cardiopulmonary disease.

## 2015-08-25 DEATH — deceased

## 2017-08-16 IMAGING — CR DG CHEST 1V
1 series · 1 of 1 positions shown · non-contrast
Comparison: Single view of the chest 12/04/2012 and CT chest,
abdomen and pelvis 01/19/2012.

CLINICAL DATA: The patient reports eating supper tonight and
choking on food.

EXAM:
CHEST  1 VIEW

[dg chest 1 view]
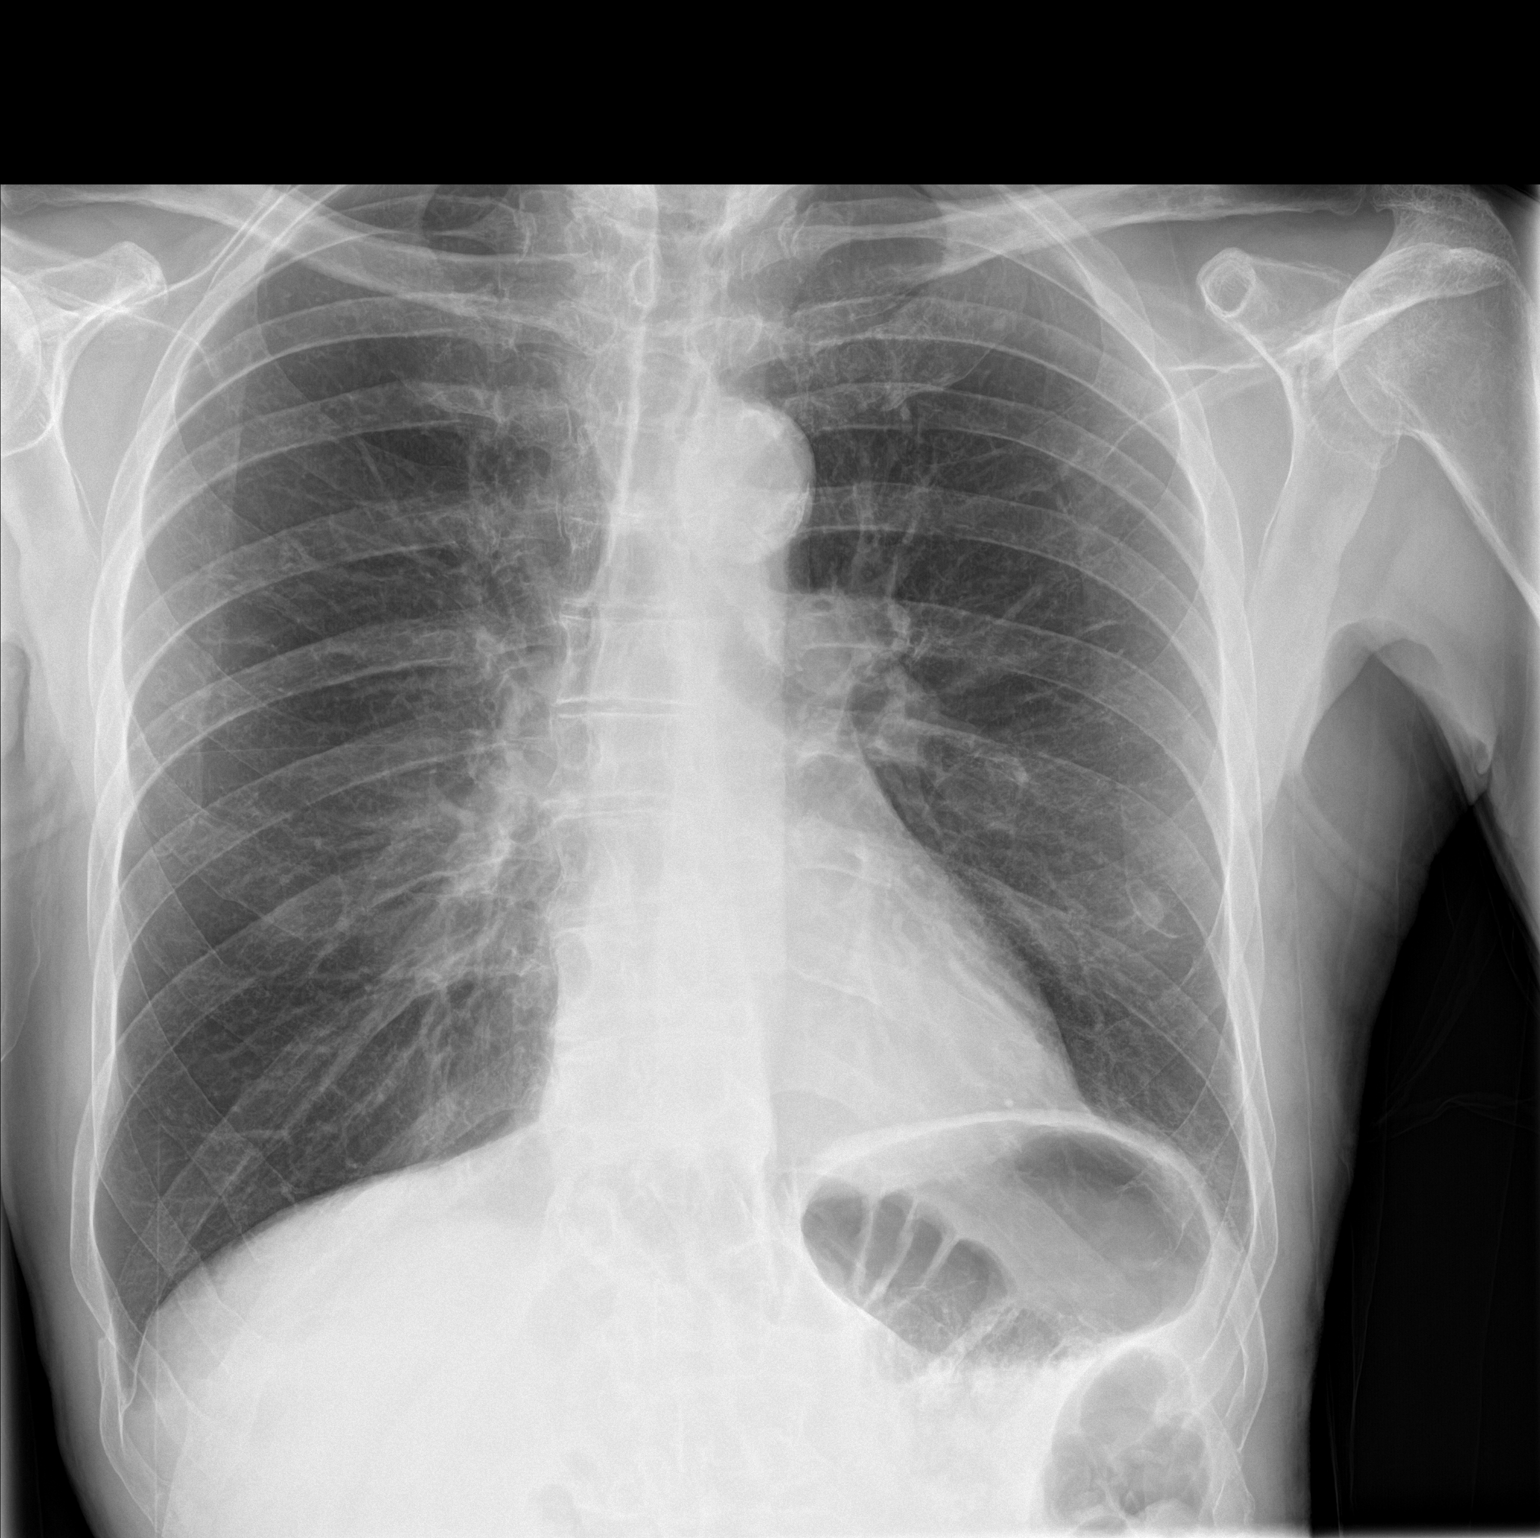

[1 of 1 positions shown; findings below may reference images not displayed]

FINDINGS: The chest is hyperexpanded. Lungs are clear. Heart size is normal.
No pneumothorax or pleural effusion. No focal bony abnormality.
IMPRESSION: No acute disease.
